# Patient Record
Sex: Male | Born: 1980 | Race: White | Hispanic: No | Marital: Single | Smoking: Former smoker
Health system: Southern US, Community
[De-identification: ages and names within clinical notes are randomized; demographics above are authoritative.]

## PROBLEM LIST (undated history)

## (undated) DIAGNOSIS — K219 Gastro-esophageal reflux disease without esophagitis: Secondary | ICD-10-CM

## (undated) DIAGNOSIS — R519 Headache, unspecified: Secondary | ICD-10-CM

## (undated) DIAGNOSIS — B019 Varicella without complication: Secondary | ICD-10-CM

## (undated) DIAGNOSIS — R51 Headache: Secondary | ICD-10-CM

## (undated) HISTORY — PX: ADENOIDECTOMY: SUR15

## (undated) HISTORY — DX: Headache: R51

## (undated) HISTORY — DX: Gastro-esophageal reflux disease without esophagitis: K21.9

## (undated) HISTORY — PX: TONSILLECTOMY: SUR1361

## (undated) HISTORY — PX: FINGER SURGERY: SHX640

## (undated) HISTORY — DX: Varicella without complication: B01.9

## (undated) HISTORY — DX: Headache, unspecified: R51.9

---
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
## 2003-08-22 ENCOUNTER — Emergency Department (HOSPITAL_COMMUNITY): Admission: EM | Admit: 2003-08-22 | Discharge: 2003-08-22 | Payer: Self-pay | Admitting: Emergency Medicine

## 2004-01-20 ENCOUNTER — Emergency Department (HOSPITAL_COMMUNITY): Admission: AC | Admit: 2004-01-20 | Discharge: 2004-01-20 | Payer: Self-pay

## 2004-01-26 ENCOUNTER — Emergency Department (HOSPITAL_COMMUNITY): Admission: EM | Admit: 2004-01-26 | Discharge: 2004-01-26 | Payer: Self-pay | Admitting: Emergency Medicine

## 2004-03-08 ENCOUNTER — Emergency Department (HOSPITAL_COMMUNITY): Admission: EM | Admit: 2004-03-08 | Discharge: 2004-03-08 | Payer: Self-pay | Admitting: Emergency Medicine

## 2005-06-22 ENCOUNTER — Emergency Department (HOSPITAL_COMMUNITY): Admission: EM | Admit: 2005-06-22 | Discharge: 2005-06-22 | Payer: Self-pay | Admitting: Family Medicine

## 2006-03-14 ENCOUNTER — Emergency Department (HOSPITAL_COMMUNITY): Admission: AD | Admit: 2006-03-14 | Discharge: 2006-03-14 | Payer: Self-pay | Admitting: Family Medicine

## 2006-11-15 ENCOUNTER — Emergency Department (HOSPITAL_COMMUNITY): Admission: EM | Admit: 2006-11-15 | Discharge: 2006-11-15 | Payer: Self-pay | Admitting: Emergency Medicine

## 2007-05-10 ENCOUNTER — Emergency Department (HOSPITAL_COMMUNITY): Admission: EM | Admit: 2007-05-10 | Discharge: 2007-05-10 | Payer: Self-pay | Admitting: Emergency Medicine

## 2007-05-24 ENCOUNTER — Ambulatory Visit: Payer: Self-pay | Admitting: *Deleted

## 2007-05-24 ENCOUNTER — Ambulatory Visit: Payer: Self-pay | Admitting: Internal Medicine

## 2009-05-25 ENCOUNTER — Emergency Department (HOSPITAL_COMMUNITY): Admission: EM | Admit: 2009-05-25 | Discharge: 2009-05-26 | Payer: Self-pay | Admitting: Emergency Medicine

## 2011-03-16 LAB — RAPID STREP SCREEN (MED CTR MEBANE ONLY): Streptococcus, Group A Screen (Direct): NEGATIVE

## 2011-04-13 ENCOUNTER — Ambulatory Visit (INDEPENDENT_AMBULATORY_CARE_PROVIDER_SITE_OTHER): Payer: Self-pay

## 2011-04-13 ENCOUNTER — Inpatient Hospital Stay (INDEPENDENT_AMBULATORY_CARE_PROVIDER_SITE_OTHER)
Admission: RE | Admit: 2011-04-13 | Discharge: 2011-04-13 | Disposition: A | Discharge status: Home / Self Care | Payer: Self-pay | Source: Ambulatory Visit | Attending: Family Medicine | Admitting: Family Medicine

## 2011-04-13 DIAGNOSIS — S90129A Contusion of unspecified lesser toe(s) without damage to nail, initial encounter: Secondary | ICD-10-CM

## 2011-04-24 NOTE — Op Note (Signed)
[place]. [facility]  Patient:    Erik Rogers                  MRN: [id number] Proc. Date: 11/05/99 Adm. Date:  [id number] Attending:  Marlowe Shores                           Operative Report  PREOPERATIVE DIAGNOSIS:  Complex laceration, dorsal aspect, right index finger.  POSTOPERATIVE DIAGNOSIS:  Complex laceration, dorsal aspect, right index finger.  OPERATION PERFORMED: 1. Repair of radial collateral ligament and joint capsule, right index finger,    metacarpophalangeal joint. 2. Repair of extensor tendon dorsal aspect, proximal phalanx, right index finger.  SURGEON:  Artist Pais. Mina Marble, M.D.  ANESTHESIA:  General.  TOURNIQUET TIME:  42 minutes.  COMPLICATIONS:  None.  DRAINS:  None.  DESCRIPTION OF PROCEDURE:  The patient was taken to the operating room and after induction of adequate general anesthesia, the right upper extremity was prepped and draped in the usual sterile fashion.  An Esmarch was used to exsanguinate the limb. The tourniquet was inflated to 250 mmHg.  At this point in time, two stellate type lacerations over the dorsal aspect of the right hand in line with the metacarpal ray of the index finger and the proximal phalanx of the index finger were approached.  The sutures that had been placed in the ER previously were removed and the incisions were widened with a 15 blade.  The proximal incision over the metacarpophalangeal joint was opened to expose a complete laceration of the sagittal band, collateral ligament on the radial side of the metacarpophalangeal joint and the joint capsule itself with exposed proximal phalangeal base and metacarpal head.  The joint was thoroughly irrigated and debrided.  The capsule  after thorough irrigation and debridement was closed with a running 4-0 Mersilene stitch followed by repair of the collateral ligament and the sagittal band using 4-0 Mersilene as well.   Attention was then paid to the dorsal aspect of the index finger over the proximal phalanx where the laceration that had been previously sutured in the emergency department was opened exposing a complex laceration to the extensor tendon over the proximal phalanx.  The extensor tendon was repaired using a combination of 4-0 Mersilene and a running 6-0 Prolene epitendinous stitch. he wounds were then thoroughly irrigated.  The rough edges were debrided and the wounds were then closed with 4-0 nylon in simple and horizontal mattress sutures combined.  A sterile dressing of Xeroform, 4 x 4s, fluffs and a splint with the  wrist in slight extension, the fingers in extension was applied.  The patient tolerated the procedure well and went to the recovery room in stable fashion. DD:  11/05/99 TD:  11/06/99 Job: [id number]

## 2012-02-05 ENCOUNTER — Emergency Department (HOSPITAL_COMMUNITY)
Admission: EM | Admit: 2012-02-05 | Discharge: 2012-02-06 | Discharge status: Against Medical Advice | Payer: No Typology Code available for payment source | Attending: Emergency Medicine | Admitting: Emergency Medicine

## 2012-02-05 ENCOUNTER — Encounter (HOSPITAL_COMMUNITY): Payer: Self-pay | Admitting: *Deleted

## 2012-02-05 DIAGNOSIS — Z043 Encounter for examination and observation following other accident: Secondary | ICD-10-CM | POA: Insufficient documentation

## 2012-02-05 NOTE — ED Notes (Signed)
Called x1. No answer.
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
## 2012-02-06 ENCOUNTER — Emergency Department (INDEPENDENT_AMBULATORY_CARE_PROVIDER_SITE_OTHER): Payer: No Typology Code available for payment source

## 2012-02-06 ENCOUNTER — Encounter (HOSPITAL_COMMUNITY): Payer: Self-pay | Admitting: Emergency Medicine

## 2012-02-06 ENCOUNTER — Emergency Department (INDEPENDENT_AMBULATORY_CARE_PROVIDER_SITE_OTHER)
Admission: EM | Admit: 2012-02-06 | Discharge: 2012-02-06 | Disposition: A | Discharge status: Home / Self Care | Payer: Self-pay | Source: Home / Self Care | Attending: Family Medicine | Admitting: Family Medicine

## 2012-02-06 DIAGNOSIS — M25519 Pain in unspecified shoulder: Secondary | ICD-10-CM

## 2012-02-06 DIAGNOSIS — M542 Cervicalgia: Secondary | ICD-10-CM

## 2012-02-06 DIAGNOSIS — M545 Low back pain: Secondary | ICD-10-CM

## 2012-02-06 DIAGNOSIS — M549 Dorsalgia, unspecified: Secondary | ICD-10-CM

## 2012-02-06 MED ORDER — HYDROCODONE-ACETAMINOPHEN 5-325 MG PO TABS: 1.0000 | ORAL_TABLET | Freq: Three times a day (TID) | ORAL | Status: AC | PRN

## 2012-02-06 MED ORDER — KETOROLAC TROMETHAMINE 60 MG/2ML IM SOLN
INTRAMUSCULAR | Status: AC
  Filled 2012-02-06: qty 2

## 2012-02-06 MED ORDER — KETOROLAC TROMETHAMINE 60 MG/2ML IM SOLN
60.0000 mg | Freq: Once | INTRAMUSCULAR | Status: AC
  Administered 2012-02-06: 60 mg via INTRAMUSCULAR

## 2012-02-06 MED ORDER — ORPHENADRINE CITRATE ER 100 MG PO TB12: 100.0000 mg | ORAL_TABLET | Freq: Two times a day (BID) | ORAL | Status: AC

## 2012-02-06 MED ORDER — MELOXICAM 15 MG PO TABS: 15.0000 mg | ORAL_TABLET | Freq: Every day | ORAL | Status: AC

## 2012-02-06 NOTE — Discharge Instructions (Signed)
Lumbosacral Strain Lumbosacral strain is one of the most common causes of back pain. There are many causes of back pain. Most are not serious conditions. CAUSES  Your backbone (spinal column) is made up of 24 main vertebral bodies, the sacrum, and the coccyx. These are held together by muscles and tough, fibrous tissue (ligaments). Nerve roots pass through the openings between the vertebrae. A sudden move or injury to the back may cause injury to, or pressure on, these nerves. This may result in localized back pain or pain movement (radiation) into the buttocks, down the leg, and into the foot. Sharp, shooting pain from the buttock down the back of the leg (sciatica) is frequently associated with a ruptured (herniated) disk. Pain may be caused by muscle spasm alone. Your caregiver can often find the cause of your pain by the details of your symptoms and an exam. In some cases, you may need tests (such as X-rays). Your caregiver will work with you to decide if any tests are needed based on your specific exam. HOME CARE INSTRUCTIONS   Avoid an underactive lifestyle. Active exercise, as directed by your caregiver, is your greatest weapon against back pain.   Avoid hard physical activities (tennis, racquetball, waterskiing) if you are not in proper physical condition for it. This may aggravate or create problems.   If you have a back problem, avoid sports requiring sudden body movements. Swimming and walking are generally safer activities.   Maintain good posture.   Avoid becoming overweight (obese).   Use bed rest for only the most extreme, sudden (acute) episode. Your caregiver will help you determine how much bed rest is necessary.   For acute conditions, you may put ice on the injured area.   Put ice in a plastic bag.   Place a towel between your skin and the bag.   Leave the ice on for 15 to 20 minutes at a time, every 2 hours, or as needed.   After you are improved and more active, it  may help to apply heat for 30 minutes before activities.  See your caregiver if you are having pain that lasts longer than expected. Your caregiver can advise appropriate exercises or therapy if needed. With conditioning, most back problems can be avoided. SEEK IMMEDIATE MEDICAL CARE IF:   You have numbness, tingling, weakness, or problems with the use of your arms or legs.   You experience severe back pain not relieved with medicines.   There is a change in bowel or bladder control.   You have increasing pain in any area of the body, including your belly (abdomen).   You notice shortness of breath, dizziness, or feel faint.   You feel sick to your stomach (nauseous), are throwing up (vomiting), or become sweaty.   You notice discoloration of your toes or legs, or your feet get very cold.   Your back pain is getting worse.   You have a fever.  MAKE SURE YOU:   Understand these instructions.   Will watch your condition.   Will get help right away if you are not doing well or get worse.  Document Released: 09/02/2005 Document Revised: 08/05/2011 Document Reviewed: 02/22/2009 [facility] Patient Information 2012 [place], [place].Cervical Sprain and Strain A cervical sprain is an injury to the neck. The injury can include either over-stretching or even small tears in the ligaments that hold the bones of the neck in place. A strain affects muscles and tendons. Minor injuries usually only involve ligaments and  muscles. Because the different parts of the neck are so close together, more severe injuries can involve both sprain and strain. These injuries can affect the muscles, ligaments, tendons, discs, and nerves in the neck. CAUSES  An injury may be the result of a direct blow or from certain habits that can lead to the symptoms noted above.  Injury from:   Contact sports (such as football, rugby, wrestling, hockey, auto racing, gymnastics, diving, martial arts, and boxing).   Motor  vehicle accidents.   Whiplash injuries (see image at right). These are common. They occur when the neck is forcefully whipped or forced backward and/or forward.   Falls.   Lifestyle or awkward postures:   Cradling a telephone between the ear and shoulder.   Sitting in a chair that offers no support.   Working at an Theme park manager station.   Activities that require hours of repeated or long periods of looking up (stretching the neck backward) or looking down (bending the head/neck forward).  SYMPTOMS   Pain, soreness, stiffness, or burning sensation in the front, back, or sides of the neck. This may develop immediately after injury. Onset of discomfort may also develop slowly and not begin for 24 hours or more.   Shoulder and/or upper back pain.   Limits to the normal movement of the neck.   Headache.   Dizziness.   Weakness and/or abnormal sensation (such as numbness or tingling) of one or both arms and/or hands.   Muscle spasm.   Difficulty with swallowing or chewing.   Tenderness and swelling at the injury site.  DIAGNOSIS  Most of the time, your caregiver can diagnose this problem with a careful history and examination. The history will include information about known problems (such as arthritis in the neck) or a previous neck injury. X-rays may be ordered to find out if there is a different problem. X-rays can also help to find problems with the bones of the neck not related to the injury or current symptoms. TREATMENT  Several treatment options are available to help pain, spasm, and other symptoms. They include:  Cold helps relieve pain and reduce inflammation. Cold should be applied for 10 to 15 minutes every 2 to 3 hours after any activity that aggravates your symptoms. Use ice packs or an ice massage. Place a towel or cloth in between your skin and the ice pack.   Medication:   Only take over-the-counter or prescription medicines for pain, discomfort, or  fever as directed by your caregiver.   Pain relievers or muscle relaxants may be prescribed. Use only as directed and only as much as you need.   Change in the activity that caused the problem. This might include using a headset with a telephone so that the phone is not propped between your ear and shoulder.   Neck collar. Your caregiver may recommend temporary use of a soft cervical collar.   Work station. Changes may be needed in your work place. A better sitting position and/or better posture during work may be part of your treatment.   Physical Therapy. Your caregiver may recommend physical therapy. This can include instructions in the use of stretching and strengthening exercises. Improvement in posture is important. Exercises and posture training can help stabilize the neck and strengthen muscles and keep symptoms from returning.  HOME CARE INSTRUCTIONS  Other than formal physical therapy, all treatments above can be done at home. Even when not at work, it is important to be conscious of  your posture and of activities that can cause a return of symptoms. Most cervical sprains and/or strains are better in 1-3 weeks. As you improve and increase activities, doing a warm up and stretching before the activity will help prevent recurrent problems. SEEK MEDICAL CARE IF:   Pain is not effectively controlled with medication.   You feel unable to decrease pain medication over time as planned.   Activity level is not improving as planned and/or expected.  SEEK IMMEDIATE MEDICAL CARE IF:   While using medication, you develop any bleeding, stomach upset, or signs of an allergic reaction.   Symptoms get worse, become intolerable, and are not helped by medications.   New, unexplained symptoms develop.   You experience numbness, tingling, weakness, or paralysis of any part of your body.  MAKE SURE YOU:   Understand these instructions.   Will watch your condition.   Will get help right away  if you are not doing well or get worse.  Document Released: 09/20/2007 Document Revised: 08/05/2011 Document Reviewed: 09/20/2007 [facility] Patient Information 2012 [place], [place].
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
## 2012-02-06 NOTE — ED Notes (Signed)
Patient transported to X-ray.  Medicine given while in xray.
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
## 2012-02-12 ENCOUNTER — Emergency Department (HOSPITAL_COMMUNITY)
Admission: EM | Admit: 2012-02-12 | Discharge: 2012-02-12 | Disposition: A | Discharge status: Home Health | Payer: Self-pay | Source: Home / Self Care | Attending: Family Medicine | Admitting: Family Medicine

## 2012-02-12 ENCOUNTER — Encounter (HOSPITAL_COMMUNITY): Payer: Self-pay

## 2012-02-12 DIAGNOSIS — IMO0002 Reserved for concepts with insufficient information to code with codable children: Secondary | ICD-10-CM

## 2012-02-12 MED ORDER — CYCLOBENZAPRINE HCL 5 MG PO TABS: 5.0000 mg | ORAL_TABLET | Freq: Three times a day (TID) | ORAL | Status: AC | PRN
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
## 2012-02-12 NOTE — Discharge Instructions (Signed)
Heat to your back and use medication as needed, see orthopedist if further problems.

## 2014-01-24 ENCOUNTER — Encounter (HOSPITAL_COMMUNITY): Payer: Self-pay | Admitting: Emergency Medicine

## 2014-01-24 ENCOUNTER — Emergency Department (INDEPENDENT_AMBULATORY_CARE_PROVIDER_SITE_OTHER)
Admission: EM | Admit: 2014-01-24 | Discharge: 2014-01-24 | Disposition: A | Discharge status: Home / Self Care | Payer: Self-pay | Source: Home / Self Care

## 2014-01-24 DIAGNOSIS — K089 Disorder of teeth and supporting structures, unspecified: Secondary | ICD-10-CM

## 2014-01-24 DIAGNOSIS — K0889 Other specified disorders of teeth and supporting structures: Secondary | ICD-10-CM

## 2014-01-24 DIAGNOSIS — S025XXA Fracture of tooth (traumatic), initial encounter for closed fracture: Secondary | ICD-10-CM

## 2014-01-24 MED ORDER — HYDROCODONE-ACETAMINOPHEN 5-325 MG PO TABS: 1.0000 | ORAL_TABLET | ORAL | Status: DC | PRN

## 2014-01-24 MED ORDER — CLINDAMYCIN HCL 300 MG PO CAPS: 300.0000 mg | ORAL_CAPSULE | Freq: Three times a day (TID) | ORAL | Status: DC

## 2014-01-24 NOTE — ED Notes (Signed)
Clarified with pharmacist that Erik Rasmussendavid mabe, np agreed to patient having hydrocodone filled without filling antibiotic at this tims

## 2014-01-24 NOTE — ED Provider Notes (Signed)
CSN: [id number]     Arrival date & time 01/24/14  1117 History   First MD Initiated Contact with Patient 01/24/14 1201     Chief Complaint  Patient presents with  . Dental Pain     (Consider location/radiation/quality/duration/timing/severity/associated sxs/prior Treatment) HPI Comments: 33 year old male complaining of a toothache for approximately one week. The painful tooth is the upper left premolar tooth. Approximately 2 days ago he was talking and a fragment of tooth fell out of his mouth. Complaining of pain radiating into the face and jaw.   History reviewed. No pertinent past medical history. Past Surgical History  Procedure Laterality Date  . Tonsillectomy    . Adenoidectomy    . Finger surgery      R index   Family History  Problem Relation Age of Onset  . Hypertension Father   . Thyroid disease Father   . Anemia Brother   . Stroke Other    History  Substance Use Topics  . Smoking status: Current Every Day Smoker -- 1.00 packs/day  . Smokeless tobacco: Not on file  . Alcohol Use: Yes     Comment: 1 q month    Review of Systems  Constitutional: Negative.   HENT: Positive for dental problem. Negative for ear pain and facial swelling.   All other systems reviewed and are negative.      Allergies  Penicillins cross reactors  Home Medications   Current Outpatient Rx  Name  Route  Sig  Dispense  Refill  . acetaminophen (TYLENOL) 325 MG tablet   Oral   Take 650 mg by mouth every 6 (six) hours as needed.         . Aspirin-Acetaminophen (GOODYS BODY PAIN PO)   Oral   Take by mouth.         . clindamycin (CLEOCIN) 300 MG capsule   Oral   Take 1 capsule (300 mg total) by mouth 3 (three) times daily. X 10 days   30 capsule   0   . HYDROcodone-acetaminophen (NORCO/VICODIN) 5-325 MG per tablet   Oral   Take 1 tablet by mouth every 4 (four) hours as needed.   15 tablet   0   . ibuprofen (ADVIL,MOTRIN) 200 MG tablet   Oral   Take 400 mg by mouth  every 6 (six) hours as needed. For pain          BP 134/93  Pulse 74  Temp(Src) 98.1 F (36.7 C) (Oral)  Resp 18  SpO2 98% Physical Exam  Nursing note and vitals reviewed. Constitutional: He is oriented to person, place, and time. He appears well-developed and well-nourished. No distress.  HENT:  Mouth/Throat: Oropharynx is clear and moist. No oropharyngeal exudate.  There is a superficial enamel fracture of the left upper pre-molar tooth. The tooth is tender. There no signs of surrounding gingival erythema, or swelling. No buccal swelling, puffiness or other evidence of infection.  Neck: Normal range of motion. Neck supple.  Neurological: He is alert and oriented to person, place, and time. He exhibits normal muscle tone.  Skin: Skin is warm and dry.  Psychiatric: He has a normal mood and affect.    ED Course  Procedures (including critical care time) Labs Review Labs Reviewed - No data to display Imaging Review No results found.    MDM   Final diagnoses:  Avulsion fracture of tooth  Toothache      Norco 5 mg #15 Clindamycin 300 tid: No signs of infection now,  if has more pain, swelling, redness, gum or mouth swelling take the ABX Find dentist ASAP  Hayden Rasmussen, NP 01/24/14 1218

## 2014-01-24 NOTE — ED Provider Notes (Signed)
Medical screening examination/treatment/procedure(s) were performed by non-physician practitioner and as supervising physician I was immediately available for consultation/collaboration.  Leslee Homeavid Arienna Benegas, M.D.  Reuben Likesavid C Paije Goodhart, MD 01/24/14 [phone]

## 2014-01-24 NOTE — ED Notes (Signed)
Tooth ache

## 2014-01-24 NOTE — Discharge Instructions (Signed)
Dental Fracture You have a dental fracture or injury. This can mean the tooth is loose, has a chip in the enamel or is broken. If just the outer enamel is chipped, there is a good chance the tooth will not become infected. The only treatment needed may be to smooth off a rough edge. Fractures into the deeper layers (dentin and pulp) cause greater pain and are more likely to become infected. These require you to see a dentist as soon as possible to save the tooth. Loose teeth may need to be wired or bonded with a plastic splint to hold them in place. A paste may be painted on the open area of the broken tooth to reduce the pain. Antibiotics and pain medicine may be prescribed. Choosing a soft or liquid diet and rinsing the mouth out with warm water after meals may be helpful. See your dentist as recommended. Failure to seek care or follow up with a dentist or other specialist as recommended could result in the loss of your tooth, infection, or permanent dental problems. SEEK MEDICAL CARE IF:   You have increased pain not controlled with medicines.  You have swelling around the tooth, in the face or neck.  You have bleeding which starts, continues, or gets worse.  You have a fever. Document Released: 12/31/2004 Document Revised: 02/15/2012 Document Reviewed: 10/15/2009 [facility] Patient Information 2014 [place], [place].  Dental Pain A tooth ache may be caused by cavities (tooth decay). Cavities expose the nerve of the tooth to air and hot or cold temperatures. It may come from an infection or abscess (also called a boil or furuncle) around your tooth. It is also often caused by dental caries (tooth decay). This causes the pain you are having. DIAGNOSIS  Your caregiver can diagnose this problem by exam. TREATMENT   If caused by an infection, it may be treated with medications which kill germs (antibiotics) and pain medications as prescribed by your caregiver. Take medications as directed.  Only  take over-the-counter or prescription medicines for pain, discomfort, or fever as directed by your caregiver.  Whether the tooth ache today is caused by infection or dental disease, you should see your dentist as soon as possible for further care. SEEK MEDICAL CARE IF: The exam and treatment you received today has been provided on an emergency basis only. This is not a substitute for complete medical or dental care. If your problem worsens or new problems (symptoms) appear, and you are unable to meet with your dentist, call or return to this location. SEEK IMMEDIATE MEDICAL CARE IF:   You have a fever.  You develop redness and swelling of your face, jaw, or neck.  You are unable to open your mouth.  You have severe pain uncontrolled by pain medicine. MAKE SURE YOU:   Understand these instructions.  Will watch your condition.  Will get help right away if you are not doing well or get worse. Document Released: 11/23/2005 Document Revised: 02/15/2012 Document Reviewed: 07/11/2008 [facility] Patient Information 2014 [place], [place].  Tooth Injuries A tooth has many layers. The outside is the enamel. The enamel is the white part. Under the enamel is the dentin. Under the dentin is the pulp, the nerves, and the blood vessels. The top part is the crown. The bottom part is the root. Three common tooth injuries include:  Breaks (fractures) usually split the tooth into 2 or more parts.  Shifts of the tooth at the level of the root.  A tooth that  comes out. HOME CARE  Minor breaks usually do not require seeing a dentist right away.  Handle tooth fragments by the outside layer. Bring these fragments to the dentist.  A tooth can also be loosened by injury and show no sign of a problem. See a dentist for an X-ray. The X-ray will look for problems below the gum line.  Gently biting into gauze or a towel will help control bleeding. An exposed nerve requires a dental exam and care. Getting help  right away is not needed if the pain is controlled.  Only take medicine as told by your dentist.  Doreatha Martin all medicine as told by your dentist.  Avoid eating solid foods and see a dentist within 24 hours. GET HELP RIGHT AWAY IF:   The pain is becoming worse rather than better.  The pain does not get better with medicine.  You have increased puffiness (swelling) or redness in your face near the injured tooth. MAKE SURE YOU:  Understand these instructions.  Will watch your condition.  Will get help right away if you are not doing well or get worse. Document Released: 05/13/2010 Document Revised: 02/15/2012 Document Reviewed: 05/13/2010 [facility] Patient Information 2014 [place], [place].

## 2014-06-18 ENCOUNTER — Encounter (HOSPITAL_COMMUNITY): Payer: Self-pay | Admitting: Emergency Medicine

## 2014-06-18 ENCOUNTER — Emergency Department (HOSPITAL_COMMUNITY)
Admission: EM | Admit: 2014-06-18 | Discharge: 2014-06-18 | Disposition: A | Discharge status: Home / Self Care | Payer: Self-pay | Attending: Emergency Medicine | Admitting: Emergency Medicine

## 2014-06-18 DIAGNOSIS — K089 Disorder of teeth and supporting structures, unspecified: Secondary | ICD-10-CM | POA: Insufficient documentation

## 2014-06-18 DIAGNOSIS — Z79899 Other long term (current) drug therapy: Secondary | ICD-10-CM | POA: Insufficient documentation

## 2014-06-18 DIAGNOSIS — Z880 Allergy status to penicillin: Secondary | ICD-10-CM | POA: Insufficient documentation

## 2014-06-18 DIAGNOSIS — K0889 Other specified disorders of teeth and supporting structures: Secondary | ICD-10-CM

## 2014-06-18 DIAGNOSIS — F17200 Nicotine dependence, unspecified, uncomplicated: Secondary | ICD-10-CM | POA: Insufficient documentation

## 2014-06-18 DIAGNOSIS — K0381 Cracked tooth: Secondary | ICD-10-CM | POA: Insufficient documentation

## 2014-06-18 DIAGNOSIS — J029 Acute pharyngitis, unspecified: Secondary | ICD-10-CM | POA: Insufficient documentation

## 2014-06-18 MED ORDER — HYDROCODONE-ACETAMINOPHEN 5-325 MG PO TABS: 1.0000 | ORAL_TABLET | ORAL | Status: DC | PRN

## 2014-06-18 MED ORDER — PENICILLIN V POTASSIUM 500 MG PO TABS: 500.0000 mg | ORAL_TABLET | Freq: Four times a day (QID) | ORAL | Status: DC

## 2014-06-18 NOTE — ED Notes (Signed)
Declined W/C at D/C and was escorted to lobby by RN. 

## 2014-06-18 NOTE — ED Notes (Signed)
Patient states R upper tooth broke in April.  Patient states has had some pain associated with it, but recently started having facial swelling and possible infection with the pain.   Patient states he didn't go to a dentist.

## 2014-06-18 NOTE — ED Provider Notes (Signed)
CSN: [id number]     Arrival date & time 06/18/14  1148 History  This chart was scribed for non-physician practitioner Emilia Beck, PA-C working with Rolland Porter, MD by Leone Payor, ED Scribe. This patient was seen in room TR06C/TR06C and the patient's care was started at 1:47 PM.    Chief Complaint  Patient presents with  . Dental Pain    The history is provided by the patient. No language interpreter was used.    HPI Comments: Erik Rogers is a 33 y.o. male who presents to the Emergency Department complaining of constant, gradually worsened right upper dental pain with associated facial swelling that began a couple of days ago. Patient states the affected tooth was fractured in April 2015 but the pain did not begin until recently. He describes the pain as sharp and pressure and rates it as 10/10 currently. He states eating food and cold worsens the pain. He reports having a sore throat, rhinorrhea for the past few days. He denies drainage, fever.   History reviewed. No pertinent past medical history. Past Surgical History  Procedure Laterality Date  . Tonsillectomy    . Adenoidectomy    . Finger surgery      R index   Family History  Problem Relation Age of Onset  . Hypertension Father   . Thyroid disease Father   . Anemia Brother   . Stroke Other    History  Substance Use Topics  . Smoking status: Current Every Day Smoker -- 1.00 packs/day    Types: Cigarettes  . Smokeless tobacco: Not on file  . Alcohol Use: Yes     Comment: 1 q month    Review of Systems  Constitutional: Negative for fever, chills and fatigue.  HENT: Positive for dental problem, facial swelling, rhinorrhea and sore throat. Negative for trouble swallowing.   Eyes: Negative for visual disturbance.  Respiratory: Negative for shortness of breath.   Cardiovascular: Negative for chest pain and palpitations.  Gastrointestinal: Negative for nausea, vomiting, abdominal pain and diarrhea.   Genitourinary: Negative for dysuria and difficulty urinating.  Musculoskeletal: Negative for arthralgias and neck pain.  Skin: Negative for color change.  Neurological: Negative for dizziness and weakness.  Psychiatric/Behavioral: Negative for dysphoric mood.      Allergies  Penicillins cross reactors  Home Medications   Prior to Admission medications   Medication Sig Start Date End Date Taking? Authorizing Provider  acetaminophen (TYLENOL) 325 MG tablet Take 650 mg by mouth every 6 (six) hours as needed.    Historical Provider, MD  Aspirin-Acetaminophen (GOODYS BODY PAIN PO) Take by mouth.    Historical Provider, MD  clindamycin (CLEOCIN) 300 MG capsule Take 1 capsule (300 mg total) by mouth 3 (three) times daily. X 10 days 01/24/14   Hayden Rasmussen, NP  HYDROcodone-acetaminophen (NORCO/VICODIN) 5-325 MG per tablet Take 1 tablet by mouth every 4 (four) hours as needed. 01/24/14   Hayden Rasmussen, NP  ibuprofen (ADVIL,MOTRIN) 200 MG tablet Take 400 mg by mouth every 6 (six) hours as needed. For pain    Historical Provider, MD   BP 105/77  Pulse 68  Temp(Src) 97.9 F (36.6 C) (Oral)  Resp 18  Ht 6' (1.829 m)  Wt 180 lb (81.647 kg)  BMI 24.41 kg/m2  SpO2 98% Physical Exam  Nursing note and vitals reviewed. Constitutional: He is oriented to person, place, and time. He appears well-developed and well-nourished. No distress.  HENT:  Head: Normocephalic and atraumatic.  Poor dentition. Premolar of  left upper jaw is cracked and tender to percussion. No abscess noted.   Eyes: Conjunctivae are normal.  Neck: Normal range of motion.  Cardiovascular: Normal rate and regular rhythm.  Exam reveals no gallop and no friction rub.   No murmur heard. Pulmonary/Chest: Effort normal and breath sounds normal. He has no wheezes. He has no rales. He exhibits no tenderness.  Abdominal: Soft. He exhibits no distension. There is no tenderness.  Musculoskeletal: Normal range of motion.  Neurological: He is  alert and oriented to person, place, and time.  Speech is goal-oriented. Moves limbs without ataxia.   Skin: Skin is warm and dry.  Psychiatric: He has a normal mood and affect.    ED Course  Procedures (including critical care time)  DIAGNOSTIC STUDIES: Oxygen Saturation is 98% on RA, normal by my interpretation.    COORDINATION OF CARE: 1:51 PM Discussed treatment plan with pt at bedside and pt agreed to plan.   Labs Review Labs Reviewed - No data to display  Imaging Review No results found.   EKG Interpretation None      MDM   Final diagnoses:  Pain, dental    Patient referred to a dentist for follow up. Patient will have Vicodin and Veetid for dental pain. Vitals stable and patient afebrile. No signs of ludwigs angina. Patient instructed to return with worsening or concerning symptoms.   I personally performed the services described in this documentation, which was scribed in my presence. The recorded information has been reviewed and is accurate.   Emilia BeckKaitlyn Evens Meno, New JerseyPA-C 06/19/14 [phone]

## 2014-06-18 NOTE — Discharge Instructions (Signed)
Take Veetid as directed until gone. Take Vicodin as needed for pain. Follow up with the recommended dentist. Refer to attached documents for more information.

## 2014-06-27 NOTE — ED Provider Notes (Signed)
Medical screening examination/treatment/procedure(s) were conducted as a shared visit with non-physician practitioner(s) and myself.  I personally evaluated the patient during the encounter.   EKG Interpretation None        Rolland PorterMark Roshard Rezabek, MD 06/27/14 1022

## 2014-07-15 ENCOUNTER — Encounter (HOSPITAL_COMMUNITY): Payer: Self-pay | Admitting: Emergency Medicine

## 2014-07-15 ENCOUNTER — Emergency Department (INDEPENDENT_AMBULATORY_CARE_PROVIDER_SITE_OTHER)
Admission: EM | Admit: 2014-07-15 | Discharge: 2014-07-15 | Disposition: A | Discharge status: Home / Self Care | Payer: Self-pay | Source: Home / Self Care | Attending: Family Medicine | Admitting: Family Medicine

## 2014-07-15 DIAGNOSIS — IMO0002 Reserved for concepts with insufficient information to code with codable children: Secondary | ICD-10-CM

## 2014-07-15 DIAGNOSIS — W57XXXA Bitten or stung by nonvenomous insect and other nonvenomous arthropods, initial encounter: Secondary | ICD-10-CM

## 2014-07-15 DIAGNOSIS — L03113 Cellulitis of right upper limb: Secondary | ICD-10-CM

## 2014-07-15 DIAGNOSIS — T148 Other injury of unspecified body region: Secondary | ICD-10-CM

## 2014-07-15 MED ORDER — CEPHALEXIN 500 MG PO CAPS: 500.0000 mg | ORAL_CAPSULE | Freq: Four times a day (QID) | ORAL | Status: DC

## 2014-07-15 MED ORDER — SULFAMETHOXAZOLE-TRIMETHOPRIM 800-160 MG PO TABS: 2.0000 | ORAL_TABLET | Freq: Two times a day (BID) | ORAL | Status: DC

## 2014-07-15 NOTE — ED Provider Notes (Signed)
Erik Rogers is a 33 y.o. male who presents to Urgent Care today for spider bite right wrist. Patient works outside. 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 The pain is mild and pt notes moderate itching. The redness has worsened and the patient has developed redness streaking proximally to his elbow.  This has occurred over the past 2 days. No fevers chills NVD. No tick bites.    History reviewed. No pertinent past medical history. History  Substance Use Topics  . Smoking status: Current Every Day Smoker -- 1.00 packs/day    Types: Cigarettes  . Smokeless tobacco: Not on file  . Alcohol Use: Yes     Comment: 1 q month   ROS as above Medications: No current facility-administered medications for this encounter.   Current Outpatient Prescriptions  Medication Sig Dispense Refill  . cephALEXin (KEFLEX) 500 MG capsule Take 1 capsule (500 mg total) by mouth 4 (four) times daily.  40 capsule  0  . sulfamethoxazole-trimethoprim (SEPTRA DS) 800-160 MG per tablet Take 2 tablets by mouth 2 (two) times daily.  28 tablet  0    Exam:  BP 128/86  Pulse 78  Temp(Src) 98.3 F (36.8 C) (Oral)  Resp 20  Ht 6' (1.829 m)  Wt 190 lb (86.183 kg)  BMI 25.76 kg/m2  SpO2 100% Gen: Well NAD Right volar wrist: Small dark central ulcerated area with surrounding erythemia and tenderness aprox 2 cm in diameter. Extending non-tender streaking erythremia present.  Lymphangitis extending to elbow.     No results found for this or any previous visit (from the past 24 hour(s)). No results found.  Assessment and Plan: 33 y.o. male with spider bite vs cellulitis.  Plan to treat with keflex and bactrim.  Present to ED if worsening.  Return as needed  Discussed warning signs or symptoms. Please see discharge instructions. Patient expresses understanding.   This note was created using Conservation officer, historic buildingsDragon voice recognition software. Any transcription errors are unintended.     Rodolph BongEvan S Octave Montrose, MD 07/15/14 [phone]

## 2014-07-15 NOTE — ED Notes (Signed)
Patient c/o redness and swelling right inner wrist x 2 days. Patient reports it was very itchy. Now he haas red streaks up his right forearm. Patient reports he "felt sick and feverish" yesterday. Patient is alert and oriented and in no acute distress.

## 2014-07-15 NOTE — Discharge Instructions (Signed)
Thank you for coming in today. Take both keflex and bactrim.  Come back as needed.  Go to the ER if worse.   Cellulitis Cellulitis is an infection of the skin and the tissue beneath it. The infected area is usually red and tender. Cellulitis occurs most often in the arms and lower legs.  CAUSES  Cellulitis is caused by bacteria that enter the skin through cracks or cuts in the skin. The most common types of bacteria that cause cellulitis are staphylococci and streptococci. SIGNS AND SYMPTOMS   Redness and warmth.  Swelling.  Tenderness or pain.  Fever. DIAGNOSIS  Your health care provider can usually determine what is wrong based on a physical exam. Blood tests may also be done. TREATMENT  Treatment usually involves taking an antibiotic medicine. HOME CARE INSTRUCTIONS   Take your antibiotic medicine as directed by your health care provider. Finish the antibiotic even if you start to feel better.  Keep the infected arm or leg elevated to reduce swelling.  Apply a warm cloth to the affected area up to 4 times per day to relieve pain.  Take medicines only as directed by your health care provider.  Keep all follow-up visits as directed by your health care provider. SEEK MEDICAL CARE IF:   You notice red streaks coming from the infected area.  Your red area gets larger or turns dark in color.  Your bone or joint underneath the infected area becomes painful after the skin has healed.  Your infection returns in the same area or another area.  You notice a swollen bump in the infected area.  You develop new symptoms.  You have a fever. SEEK IMMEDIATE MEDICAL CARE IF:   You feel very sleepy.  You develop vomiting or diarrhea.  You have a general ill feeling (malaise) with muscle aches and pains. MAKE SURE YOU:   Understand these instructions.  Will watch your condition.  Will get help right away if you are not doing well or get worse. Document Released:  09/02/2005 Document Revised: 04/09/2014 Document Reviewed: 02/08/2012 [facility] Patient Information 2015 [place]ExitCare, [place]LL[place]. This information is not intended to replace advice given to you by your health care provider. Make sure you discuss any questions you have with your health care provider.

## 2015-08-05 ENCOUNTER — Ambulatory Visit (INDEPENDENT_AMBULATORY_CARE_PROVIDER_SITE_OTHER): Payer: 59

## 2015-08-05 ENCOUNTER — Ambulatory Visit (INDEPENDENT_AMBULATORY_CARE_PROVIDER_SITE_OTHER): Payer: 59 | Admitting: Emergency Medicine

## 2015-08-05 VITALS — BP 128/68 | HR 76 | Temp 98.3°F | Resp 18 | Ht 72.0 in | Wt 195.4 lb

## 2015-08-05 DIAGNOSIS — M25532 Pain in left wrist: Secondary | ICD-10-CM

## 2015-08-05 DIAGNOSIS — S63502A Unspecified sprain of left wrist, initial encounter: Secondary | ICD-10-CM | POA: Diagnosis not present

## 2015-08-05 MED ORDER — NAPROXEN SODIUM 550 MG PO TABS: 550.0000 mg | ORAL_TABLET | Freq: Two times a day (BID) | ORAL | Status: DC

## 2015-08-05 NOTE — Patient Instructions (Signed)
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
## 2016-03-30 ENCOUNTER — Ambulatory Visit (INDEPENDENT_AMBULATORY_CARE_PROVIDER_SITE_OTHER): Payer: 59 | Admitting: Family Medicine

## 2016-03-30 ENCOUNTER — Encounter: Payer: Self-pay | Admitting: Family Medicine

## 2016-03-30 VITALS — BP 138/92 | HR 81 | Temp 98.1°F | Ht 71.0 in | Wt 197.5 lb

## 2016-03-30 DIAGNOSIS — G2581 Restless legs syndrome: Secondary | ICD-10-CM | POA: Diagnosis not present

## 2016-03-30 DIAGNOSIS — B070 Plantar wart: Secondary | ICD-10-CM | POA: Diagnosis not present

## 2016-03-30 DIAGNOSIS — J020 Streptococcal pharyngitis: Secondary | ICD-10-CM

## 2016-03-30 DIAGNOSIS — K219 Gastro-esophageal reflux disease without esophagitis: Secondary | ICD-10-CM

## 2016-03-30 LAB — POCT RAPID STREP A (OFFICE): Rapid Strep A Screen: POSITIVE — AB

## 2016-03-30 MED ORDER — PANTOPRAZOLE SODIUM 40 MG PO TBEC: 40.0000 mg | DELAYED_RELEASE_TABLET | Freq: Every day | ORAL | Status: DC

## 2016-03-30 MED ORDER — GABAPENTIN 300 MG PO CAPS: 300.0000 mg | ORAL_CAPSULE | Freq: Every day | ORAL | Status: DC

## 2016-03-30 NOTE — Patient Instructions (Signed)
Take the gabapentin nightly for the restless legs. If we need to increase we can.  Take the protonix daily for reflux.  Follow up annually or sooner if needed.  Take care  Dr. Adriana Simasook

## 2016-03-30 NOTE — Progress Notes (Signed)
Pre visit review using our clinic review tool, if applicable. No additional management support is needed unless otherwise documented below in the visit note. 

## 2016-03-31 DIAGNOSIS — K219 Gastro-esophageal reflux disease without esophagitis: Secondary | ICD-10-CM | POA: Insufficient documentation

## 2016-03-31 DIAGNOSIS — G2581 Restless legs syndrome: Secondary | ICD-10-CM | POA: Insufficient documentation

## 2016-03-31 DIAGNOSIS — B070 Plantar wart: Secondary | ICD-10-CM | POA: Insufficient documentation

## 2016-03-31 DIAGNOSIS — J020 Streptococcal pharyngitis: Secondary | ICD-10-CM | POA: Insufficient documentation

## 2016-03-31 MED ORDER — AZITHROMYCIN 250 MG PO TABS
ORAL_TABLET | ORAL | Status: DC
Start: 2016-03-31 — End: 2016-05-21

## 2016-03-31 NOTE — Assessment & Plan Note (Signed)
New problem. Rapid strep positive. Treating with Azithromycin.

## 2016-03-31 NOTE — Assessment & Plan Note (Signed)
Trial of Gabapentin. 

## 2016-03-31 NOTE — Progress Notes (Signed)
Subjective:  Patient ID: Erik Rogers, male    DOB: Jul 17, 1981  Age: 35 y.o. MRN: [id number]  CC: Sore throat/Neck pain/Ear pain; Restless Leg; L foot "wound"; GERD  HPI Erik Rogers is a 35 y.o. male presents to the clinic today with the above complaints.  Sore throat  Started yesterday.  Moderate in severity.  Associated Left ear pain and anterior neck pain.  No associated fever, chills.  No known exacerbating or relieving factors.  RLS  Patient reports he has had restless leg for years.  Troublesome at night.  Requesting medication today.  GERD  Long standing.  Uncontrolled.  Using OTC meds with little improvement.  No dysphagia or other red flag symptoms (weight loss, abdominal pain, etc).  "Wound"  Patient reports an area of concern on his left foot.  Has been present for nearly a year.  Intermittently painful.  No drainage or reported redness.  Likely exacerbated by foot wear.  No known relieving factors.  PMH, Surgical Hx, Family Hx, Social History reviewed and updated as below.  Past Medical History  Diagnosis Date  . Chicken pox   . GERD (gastroesophageal reflux disease)   . Frequent headaches    Past Surgical History  Procedure Laterality Date  . Tonsillectomy    . Adenoidectomy    . Finger surgery      R index   Family History  Problem Relation Age of Onset  . Hypertension Father   . Thyroid disease Father   . Anemia Brother   . Cancer Brother   . Heart disease Brother   . Stroke Other   . Heart disease Mother   . Stroke Mother    Social History  Substance Use Topics  . Smoking status: Current Every Day Smoker -- 1.00 packs/day    Types: Cigarettes  . Smokeless tobacco: Never Used  . Alcohol Use: No     Comment: 1 q month   Review of Systems  HENT: Positive for tinnitus.   Cardiovascular: Positive for leg swelling.  Skin: Positive for wound.  Neurological: Positive for headaches.    Psychiatric/Behavioral:       Stress.  All other systems reviewed and are negative.  Objective:   Today's Vitals: BP 138/92 mmHg  Pulse 81  Temp(Src) 98.1 F (36.7 C) (Oral)  Ht 5\' 11"  (1.803 m)  Wt 197 lb 8 oz (89.585 kg)  BMI 27.56 kg/m2  SpO2 98%  Physical Exam  Constitutional: He is oriented to person, place, and time. He appears well-developed and well-nourished. No distress.  HENT:  Head: Normocephalic and atraumatic.  Mild oropharyngeal erythema. No exudate. Normal TM's bilaterally.   Eyes: Conjunctivae are normal. No scleral icterus.  Neck: Neck supple.  Anterior cervical lymphadenopathy (left).  Cardiovascular: Normal rate and regular rhythm.   No murmur heard. Pulmonary/Chest: Effort normal and breath sounds normal. He has no wheezes. He has no rales.  Abdominal: Soft. He exhibits no distension. There is no tenderness. There is no rebound and no guarding.  Musculoskeletal: Normal range of motion. He exhibits no edema.  Neurological: He is alert and oriented to person, place, and time.  Skin:  Left lateral foot (plantar) - small plantar wart noted.  Psychiatric: He has a normal mood and affect.  Vitals reviewed.  Assessment & Plan:   Problem List Items Addressed This Visit    Acute streptococcal pharyngitis - Primary    New problem. Rapid strep positive. Treating with Azithromycin.  Relevant Medications   azithromycin (ZITHROMAX) 250 MG tablet   Other Relevant Orders   POCT rapid strep A (Completed)   RLS (restless legs syndrome)    Trial of Gabapentin.      GERD (gastroesophageal reflux disease)    Uncontrolled. Treating with Protonix.      Relevant Medications   pantoprazole (PROTONIX) 40 MG tablet   Plantar wart    Discussed treatment options. Patient will wait at this time as it is not particularly bothersome.      Relevant Medications   azithromycin (ZITHROMAX) 250 MG tablet      Outpatient Encounter Prescriptions as of  03/30/2016  Medication Sig  . azithromycin (ZITHROMAX) 250 MG tablet 2 tablets on Day 1; 1 tablet on Day 2-5.  Marland Kitchen gabapentin (NEURONTIN) 300 MG capsule Take 1 capsule (300 mg total) by mouth at bedtime.  . pantoprazole (PROTONIX) 40 MG tablet Take 1 tablet (40 mg total) by mouth daily.  . [DISCONTINUED] cephALEXin (KEFLEX) 500 MG capsule Take 1 capsule (500 mg total) by mouth 4 (four) times daily. (Patient not taking: Reported on 08/05/2015)  . [DISCONTINUED] IBUPROFEN PO Take by mouth.  . [DISCONTINUED] naproxen sodium (ANAPROX DS) 550 MG tablet Take 1 tablet (550 mg total) by mouth 2 (two) times daily with a meal.  . [DISCONTINUED] sulfamethoxazole-trimethoprim (SEPTRA DS) 800-160 MG per tablet Take 2 tablets by mouth 2 (two) times daily. (Patient not taking: Reported on 08/05/2015)   No facility-administered encounter medications on file as of 03/30/2016.    Follow-up: Annually.  Everlene Other DO [facility]

## 2016-03-31 NOTE — Assessment & Plan Note (Signed)
Uncontrolled. Treating with Protonix.

## 2016-03-31 NOTE — Assessment & Plan Note (Signed)
Discussed treatment options. Patient will wait at this time as it is not particularly bothersome.

## 2016-04-26 ENCOUNTER — Encounter: Payer: Self-pay | Admitting: Emergency Medicine

## 2016-04-26 ENCOUNTER — Emergency Department
Admission: EM | Admit: 2016-04-26 | Discharge: 2016-04-26 | Disposition: A | Discharge status: Home / Self Care | Payer: 59 | Attending: Emergency Medicine | Admitting: Emergency Medicine

## 2016-04-26 DIAGNOSIS — F17210 Nicotine dependence, cigarettes, uncomplicated: Secondary | ICD-10-CM | POA: Diagnosis not present

## 2016-04-26 DIAGNOSIS — H9202 Otalgia, left ear: Secondary | ICD-10-CM | POA: Diagnosis present

## 2016-04-26 DIAGNOSIS — K122 Cellulitis and abscess of mouth: Secondary | ICD-10-CM | POA: Diagnosis not present

## 2016-04-26 MED ORDER — CLINDAMYCIN HCL 300 MG PO CAPS: 300.0000 mg | ORAL_CAPSULE | Freq: Three times a day (TID) | ORAL | Status: DC

## 2016-04-26 MED ORDER — IBUPROFEN 800 MG PO TABS: 800.0000 mg | ORAL_TABLET | Freq: Three times a day (TID) | ORAL | Status: AC | PRN

## 2016-04-26 NOTE — ED Notes (Signed)
C/O left ear pain, left jaw pain.  Onset of symptoms last night.

## 2016-04-26 NOTE — Discharge Instructions (Signed)
Please see your dentist in follow up this week.   Dental Abscess    A dental abscess is a collection of pus in or around a tooth.  CAUSES  This condition is caused by a bacterial infection around the root of the tooth that involves the inner part of the tooth (pulp). It may result from:  Severe tooth decay.  Trauma to the tooth that allows bacteria to enter into the pulp, such as a broken or chipped tooth.  Severe gum disease around a tooth. SYMPTOMS  Symptoms of this condition include:  Severe pain in and around the infected tooth.  Swelling and redness around the infected tooth, in the mouth, or in the face.  Tenderness.  Pus drainage.  Bad breath.  Bitter taste in the mouth.  Difficulty swallowing.  Difficulty opening the mouth.  Nausea.  Vomiting.  Chills.  Swollen neck glands.  Fever. DIAGNOSIS  This condition is diagnosed with examination of the infected tooth. During the exam, your dentist may tap on the infected tooth. Your dentist will also ask about your medical and dental history and may order X-rays.  TREATMENT  This condition is treated by eliminating the infection. This may be done with:  Antibiotic medicine.  A root canal. This may be performed to save the tooth.  Pulling (extracting) the tooth. This may also involve draining the abscess. This is done if the tooth cannot be saved. HOME CARE INSTRUCTIONS  Take medicines only as directed by your dentist.  If you were prescribed antibiotic medicine, finish all of it even if you start to feel better.  Rinse your mouth (gargle) often with salt water to relieve pain or swelling.  Do not drive or operate heavy machinery while taking pain medicine.  Do not apply heat to the outside of your mouth.  Keep all follow-up visits as directed by your dentist. This is important. SEEK MEDICAL CARE IF:  Your pain is worse and is not helped by medicine. SEEK IMMEDIATE MEDICAL CARE IF:  You have a fever or chills.  Your symptoms  suddenly get worse.  You have a very bad headache.  You have problems breathing or swallowing.  You have trouble opening your mouth.  You have swelling in your neck or around your eye. This information is not intended to replace advice given to you by your health care provider. Make sure you discuss any questions you have with your health care provider.  Document Released: 11/23/2005 Document Revised: 04/09/2015 Document Reviewed: 11/20/2014  Elsevier Interactive Patient Education [employer]2016 [employer]vier Inc.

## 2016-04-26 NOTE — ED Notes (Signed)
NAD noted at time of D/C. Pt denies questions or concerns. Pt ambulatory to the lobby at this time.  

## 2016-04-26 NOTE — ED Provider Notes (Signed)
[facility] Emergency Department Provider Note  ____________________________________________  Time seen: Approximately 11:45 AM  I have reviewed the triage vital signs and the nursing notes.   HISTORY  Chief Complaint Otalgia    HPI Erik Rogers is a 35 y.o. male , NAD, presents to the emergency department with one-day history of left ear and mouth pain. States pain onset suddenly last night which caused him decreased sleep. States he looked in his mouth and saw redness around one of his wisdom teeth. Does note he has occasional TMJ pain when he wakes in the mornings but is uncertain of any chronic finding or clenching of his teeth. Does note that he is due for a follow-up with his dentist and plans to see them on Monday. Denies any injury or trauma to the face or mouth. Has not had any nasal congestion, runny nose, sneezing, sinus pressure, discharge from the ears, cough, chest congestion. Has not had fever, chills, body aches. Denies any abdominal pain, nausea, vomiting.   Past Medical History  Diagnosis Date  . Chicken pox   . GERD (gastroesophageal reflux disease)   . Frequent headaches     Patient Active Problem List   Diagnosis Date Noted  . Acute streptococcal pharyngitis 03/31/2016  . RLS (restless legs syndrome) 03/31/2016  . GERD (gastroesophageal reflux disease) 03/31/2016  . Plantar wart 03/31/2016    Past Surgical History  Procedure Laterality Date  . Tonsillectomy    . Adenoidectomy    . Finger surgery      R index    Current Outpatient Rx  Name  Route  Sig  Dispense  Refill  . azithromycin (ZITHROMAX) 250 MG tablet      2 tablets on Day 1; 1 tablet on Day 2-5.   6 tablet   0   . clindamycin (CLEOCIN) 300 MG capsule   Oral   Take 1 capsule (300 mg total) by mouth 3 (three) times daily.   30 capsule   0   . gabapentin (NEURONTIN) 300 MG capsule   Oral   Take 1 capsule (300 mg total) by mouth at bedtime.   90  capsule   0   . ibuprofen (ADVIL,MOTRIN) 800 MG tablet   Oral   Take 1 tablet (800 mg total) by mouth every 8 (eight) hours as needed (pain).   60 tablet   0   . pantoprazole (PROTONIX) 40 MG tablet   Oral   Take 1 tablet (40 mg total) by mouth daily.   90 tablet   0     Allergies Penicillins cross reactors  Family History  Problem Relation Age of Onset  . Hypertension Father   . Thyroid disease Father   . Anemia Brother   . Cancer Brother   . Heart disease Brother   . Stroke Other   . Heart disease Mother   . Stroke Mother     Social History Social History  Substance Use Topics  . Smoking status: Current Every Day Smoker -- 1.00 packs/day    Types: Cigarettes  . Smokeless tobacco: Never Used  . Alcohol Use: No     Comment: 1 q month     Review of Systems  Constitutional: No fever/chills, fatigue ENT: Positive left dental pain and left ear pain. No sore throat, sneezing, nasal congestion, runny nose, discharge from ears. Cardiovascular: No chest pain. Respiratory: No cough or chest congestion. No shortness of breath. No wheezing.  Gastrointestinal: No abdominal pain.  No  nausea, vomiting.  No diarrhea.  Musculoskeletal: Occasional TMJ pain. Negative for neck pain.  Skin: Negative for rash, redness, swelling, skin sores. Neurological: Negative for headaches, focal weakness or numbness. No tingling. 10-point ROS otherwise negative.  ____________________________________________   PHYSICAL EXAM:  VITAL SIGNS: ED Triage Vitals  Enc Vitals Group     BP 04/26/16 1118 124/72 mmHg     Pulse Rate 04/26/16 1118 65     Resp 04/26/16 1118 16     Temp 04/26/16 1118 98 F (36.7 C)     Temp Source 04/26/16 1118 Oral     SpO2 04/26/16 1118 99 %     Weight 04/26/16 1118 190 lb (86.183 kg)     Height 04/26/16 1118 6' (1.829 m)     Head Cir --      Peak Flow --      Pain Score 04/26/16 1119 10     Pain Loc --      Pain Edu? --      Excl. in GC? --       Constitutional: Alert and oriented. Well appearing and in no acute distress. Eyes: Conjunctivae are normal.  Head: Atraumatic. ENT:      Ears: TMs visualized bilaterally without erythema, effusion, bulging, perforation.      Nose: No congestion/rhinnorhea.      Mouth/Throat: Mucous membranes are moist. Pharynx without erythema, swelling, exudate. Uvula is midline. Mild erythema noted about the left medial gumline at the left wisdom tooth. Diffusely poor dentition. Neck: Supple with full range of motion. Hematological/Lymphatic/Immunilogical: No cervical lymphadenopathy. Cardiovascular: Normal rate, regular rhythm. Normal S1 and S2.  Good peripheral circulation with 2+ pulses noted in the upper extremities. Respiratory: Normal respiratory effort without tachypnea or retractions. Lungs CTAB with breath sounds noted in all lung fields. Musculoskeletal: Full range of motion of the TMJ without pain. No crepitus or popping noted about the TMJ. Neurologic:  Normal speech and language. Gait and posture are normal. Skin:  Skin is warm, dry and intact. No rash, redness, swelling, skin sores noted. Psychiatric: Mood and affect are normal. Speech and behavior are normal. Patient exhibits appropriate insight and judgement.   ____________________________________________   LABS  None ____________________________________________  EKG  None ____________________________________________  RADIOLOGY  None ____________________________________________    PROCEDURES  Procedure(s) performed: None    Medications - No data to display   ____________________________________________   INITIAL IMPRESSION / ASSESSMENT AND PLAN / ED COURSE  Patient's diagnosis is consistent with abscess and cellulitis of oral soft tissues. Patient will be discharged home with prescriptions for clindamycin and ibuprofen to take as directed. Patient is to follow up with his dentist this week for recheck.  Patient is given ED precautions to return to the ED for any worsening or new symptoms.      ____________________________________________  FINAL CLINICAL IMPRESSION(S) / ED DIAGNOSES  Final diagnoses:  Abscess or cellulitis, oral soft tissue      NEW MEDICATIONS STARTED DURING THIS VISIT:  Discharge Medication List as of 04/26/2016 11:59 AM    START taking these medications   Details  clindamycin (CLEOCIN) 300 MG capsule Take 1 capsule (300 mg total) by mouth 3 (three) times daily., Starting 04/26/2016, Until Discontinued, Print    ibuprofen (ADVIL,MOTRIN) 800 MG tablet Take 1 tablet (800 mg total) by mouth every 8 (eight) hours as needed (pain)., Starting 04/26/2016, Until Discontinued, Print             Hope Pigeon, PA-C 04/26/16 1229  Sharyn CreamerMark Quale, MD 04/26/16 [phone]

## 2016-05-21 ENCOUNTER — Encounter: Payer: Self-pay | Admitting: Family Medicine

## 2016-05-21 ENCOUNTER — Ambulatory Visit (INDEPENDENT_AMBULATORY_CARE_PROVIDER_SITE_OTHER): Payer: 59 | Admitting: Family Medicine

## 2016-05-21 VITALS — BP 126/90 | HR 82 | Temp 98.2°F | Wt 190.1 lb

## 2016-05-21 DIAGNOSIS — F32A Depression, unspecified: Secondary | ICD-10-CM

## 2016-05-21 DIAGNOSIS — F419 Anxiety disorder, unspecified: Principal | ICD-10-CM

## 2016-05-21 DIAGNOSIS — F418 Other specified anxiety disorders: Secondary | ICD-10-CM

## 2016-05-21 DIAGNOSIS — F329 Major depressive disorder, single episode, unspecified: Secondary | ICD-10-CM

## 2016-05-21 MED ORDER — ESCITALOPRAM OXALATE 10 MG PO TABS: 10.0000 mg | ORAL_TABLET | Freq: Every day | ORAL | Status: DC

## 2016-05-21 MED ORDER — TRAZODONE HCL 50 MG PO TABS: 25.0000 mg | ORAL_TABLET | Freq: Every evening | ORAL | Status: DC | PRN

## 2016-05-21 NOTE — Progress Notes (Signed)
Pre visit review using our clinic review tool, if applicable. No additional management support is needed unless otherwise documented below in the visit note. 

## 2016-05-21 NOTE — Patient Instructions (Signed)
Take the trazodone at bedtime for sleep.  Take the lexapro daily.  We will call with your appt to talk to someone.  Follow up in 6 weeks.  Take care  Dr. Adriana Simasook

## 2016-05-22 DIAGNOSIS — F419 Anxiety disorder, unspecified: Principal | ICD-10-CM

## 2016-05-22 DIAGNOSIS — Z720 Tobacco use: Secondary | ICD-10-CM | POA: Insufficient documentation

## 2016-05-22 DIAGNOSIS — F329 Major depressive disorder, single episode, unspecified: Secondary | ICD-10-CM | POA: Insufficient documentation

## 2016-05-22 DIAGNOSIS — F32A Depression, unspecified: Secondary | ICD-10-CM | POA: Insufficient documentation

## 2016-05-22 NOTE — Assessment & Plan Note (Signed)
New problem. Severe. Sending to psychology for therapy/counseling. Starting Lexapro. Note given for patient to be out of work.

## 2016-05-22 NOTE — Progress Notes (Signed)
Subjective:  Patient ID: Erik Rogers, male    DOB: 28-Jan-1981  Age: 35 y.o. MRN: [id number]  CC: Depression/Anxiety (note this is different from reported reason for the appt on schedule as patient did not want to disclose).  HPI:  35 year old male presents with several complaints today.  Patient states that he's recently been experiencing a lot of stress and difficulty in his personal life. Patient states he feels like he is having a nervous breakdown. He states that he's having significant difficulty concentrating and focusing particularly at work. He also states that he feels down and also feels anxious. He states that this is been going on for months but has recently worsened. It started after the death of his mother earlier last year (Sept). Her death was unexpected. This is been difficult for him. Thanks have recently worsened as he and his fiance recently split up approximately a month ago. Patient states that he is experiencing a lot of stress and anxiety and is having severe difficulty at work. He's also having insomnia. He states that he is been unable to work. He's not motivated to get out of bed and when he's at work he can't focus. Patient has spoken to his HR person and she recommended that he come in to be seen. He is at his wits end and does not know what to do. He's not sought any counseling or talked to anyone.  Social Hx   Social History   Social History  . Marital Status: Single    Spouse Name: N/A  . Number of Children: N/A  . Years of Education: N/A   Social History Main Topics  . Smoking status: Current Every Day Smoker -- 1.00 packs/day    Types: Cigarettes  . Smokeless tobacco: Never Used  . Alcohol Use: No     Comment: 1 q month  . Drug Use: No  . Sexual Activity:    Partners: Female    CopyBirth Control/ Protection: None   Other Topics Concern  . None   Social History Narrative   Review of Systems  Constitutional: Negative.     Psychiatric/Behavioral: Positive for sleep disturbance and decreased concentration. The patient is nervous/anxious.    Objective:  BP 126/90 mmHg  Pulse 82  Temp(Src) 98.2 F (36.8 C) (Oral)  Wt 190 lb 2 oz (86.24 kg)  SpO2 97%  BP/Weight 05/21/2016 04/26/2016 03/30/2016  Systolic BP 126 132 138  Diastolic BP 90 74 92  Wt. (Lbs) 190.13 190 197.5  BMI 25.78 25.76 27.56   Physical Exam  Constitutional: He is oriented to person, place, and time. He appears well-developed. No distress.  Eyes: Conjunctivae are normal. No scleral icterus.  Pulmonary/Chest: Effort normal.  Neurological: He is alert and oriented to person, place, and time.  Psychiatric:  Flat affect, depressed mood; also appears anxious.  Vitals reviewed.  PHQ-9 - 22. GAD-7 - 21  Assessment & Plan:   Problem List Items Addressed This Visit    Anxiety and depression - Primary    New problem. Severe. Sending to psychology for therapy/counseling. Starting Lexapro. Note given for patient to be out of work.         Meds ordered this encounter  Medications  . escitalopram (LEXAPRO) 10 MG tablet    Sig: Take 1 tablet (10 mg total) by mouth daily.    Dispense:  90 tablet    Refill:  1  . traZODone (DESYREL) 50 MG tablet    Sig: Take 0.5-1 tablets (  25-50 mg total) by mouth at bedtime as needed for sleep.    Dispense:  90 tablet    Refill:  0    Follow-up: Return in about 6 weeks (around 07/02/2016).  Everlene Other DO [facility]

## 2016-05-27 ENCOUNTER — Telehealth: Payer: Self-pay | Admitting: Family Medicine

## 2016-05-27 NOTE — Telephone Encounter (Signed)
Paperwork was received and placed in Dr.Cook's FMLA folder to complete.

## 2016-05-27 NOTE — Telephone Encounter (Signed)
Sheralyn Boatmanoni [phone] called from [employer] regarding wanting to know medical information from pt last visit. Claim number [id number]. Also regarding FMLA. Thank you!

## 2016-05-27 NOTE — Telephone Encounter (Signed)
Call them and ask to have it re-faxed to [phone]. Because we have not received.

## 2016-05-27 NOTE — Telephone Encounter (Signed)
Ethelene Brownsnthony stated that they will be faxing over an Attending Physician Statement combined with FMLA paperwork to  [phone].  Paperwork needs to be filled out by Dr. Adriana Simasook.

## 2016-06-10 ENCOUNTER — Telehealth: Payer: Self-pay | Admitting: Family Medicine

## 2016-06-10 NOTE — Telephone Encounter (Signed)
Patient called wanting a refill on Clindamycin to help with his tooth ache. He was seen recently by a dentist that referred him to a oral surgeon. However, due to the wait he was

## 2016-06-24 ENCOUNTER — Ambulatory Visit (INDEPENDENT_AMBULATORY_CARE_PROVIDER_SITE_OTHER): Payer: Self-pay | Admitting: Psychology

## 2016-06-24 DIAGNOSIS — F4310 Post-traumatic stress disorder, unspecified: Secondary | ICD-10-CM

## 2016-07-10 ENCOUNTER — Ambulatory Visit (INDEPENDENT_AMBULATORY_CARE_PROVIDER_SITE_OTHER): Payer: 59 | Admitting: Psychology

## 2016-07-10 DIAGNOSIS — F4310 Post-traumatic stress disorder, unspecified: Secondary | ICD-10-CM

## 2016-07-17 ENCOUNTER — Ambulatory Visit: Payer: 59 | Admitting: Psychology

## 2016-08-26 IMAGING — CR DG WRIST COMPLETE 3+V*L*
4 series · 4 of 4 positions shown · non-contrast
Comparison: None.

CLINICAL DATA: Left wrist pain after injury loading motorcycle.
Initial encounter.

EXAM:
LEFT WRIST - COMPLETE 3+ VIEW

[PA]
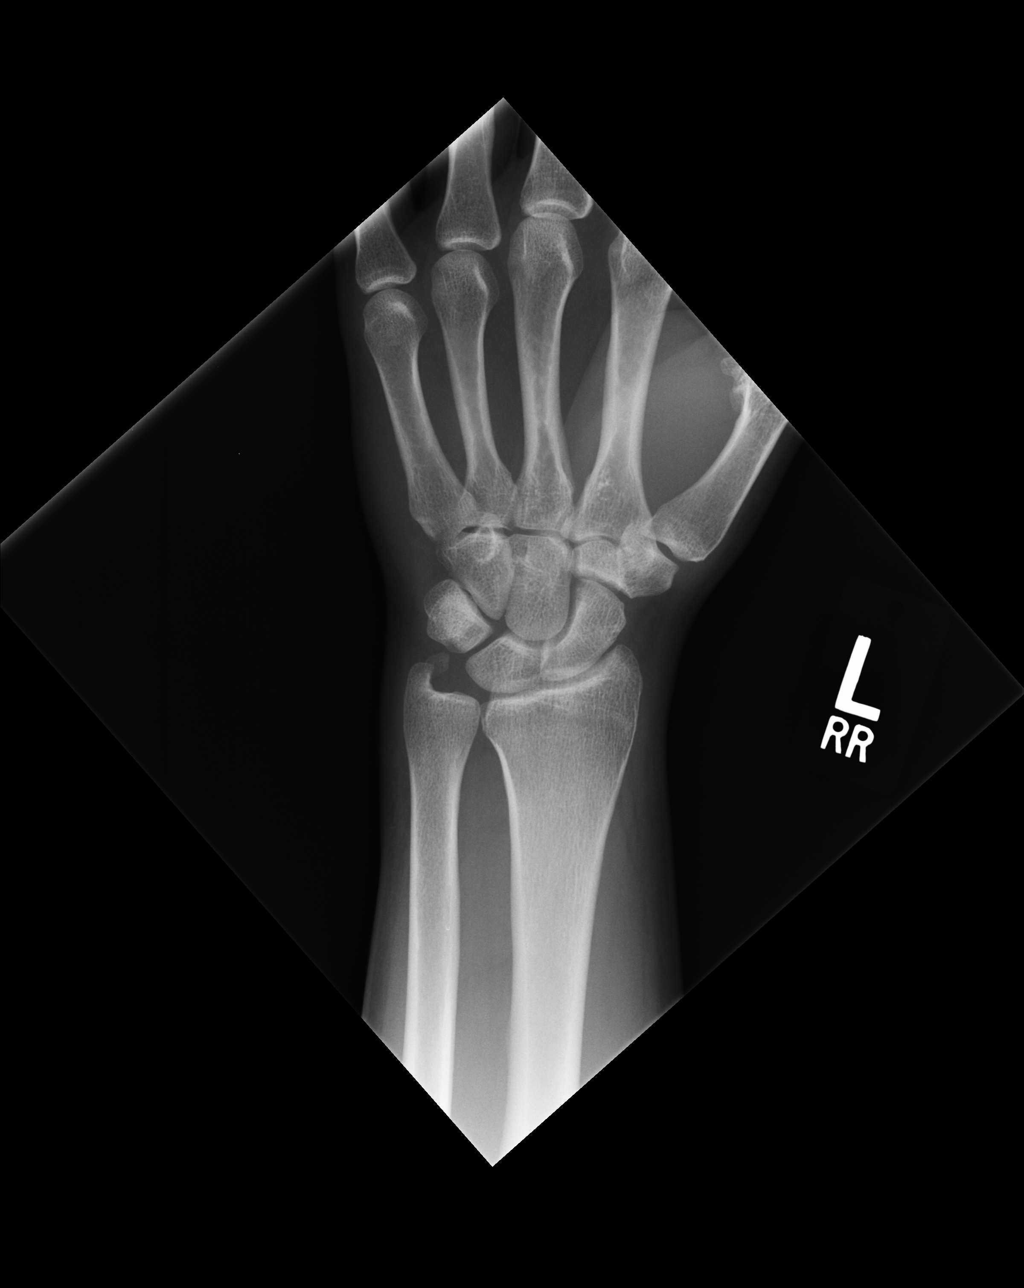

[pa int rot]
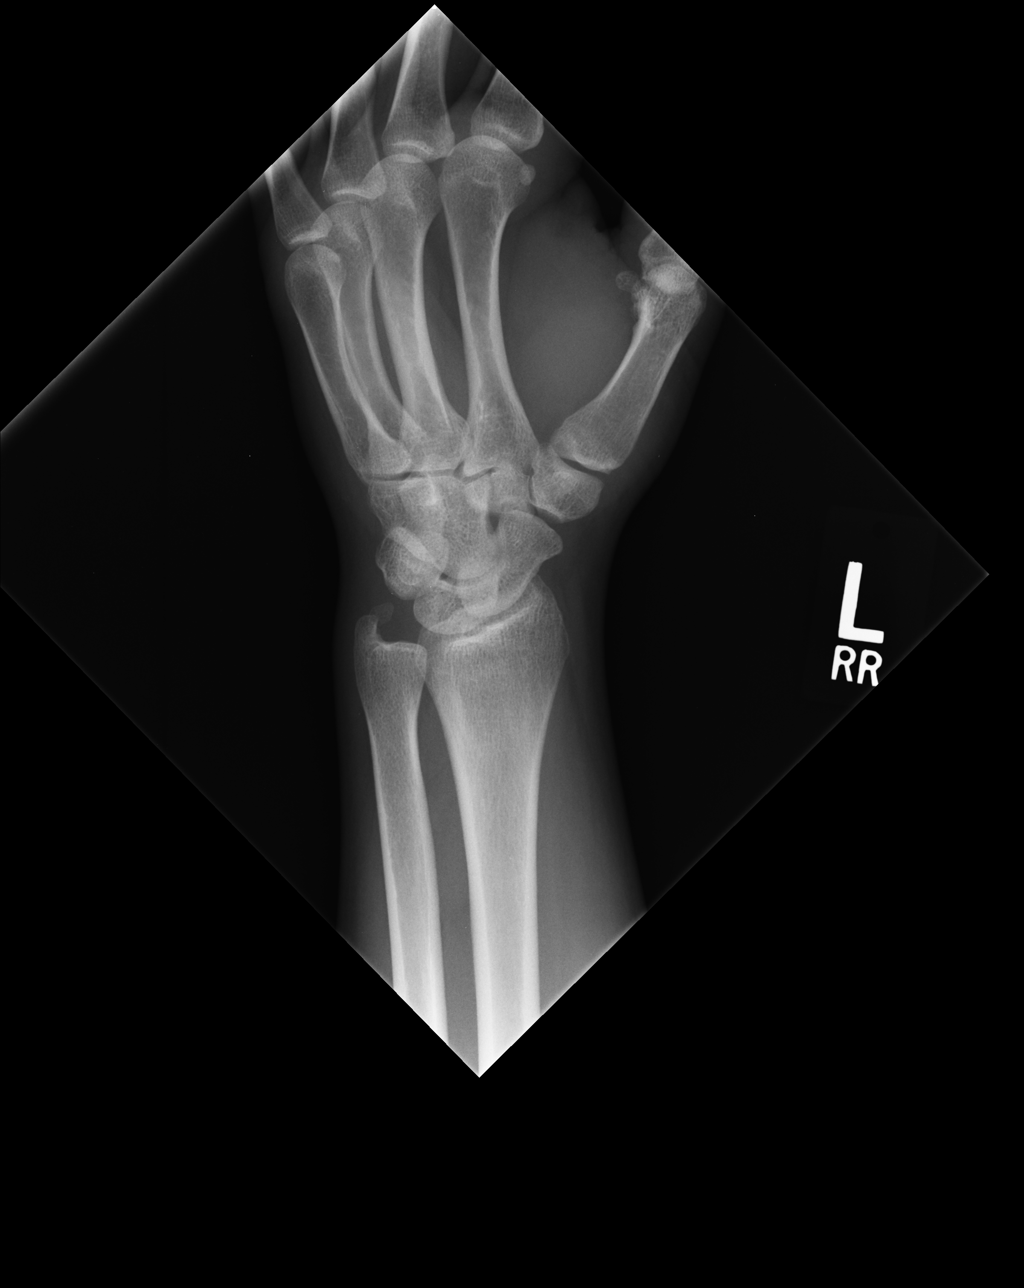

[lateral]
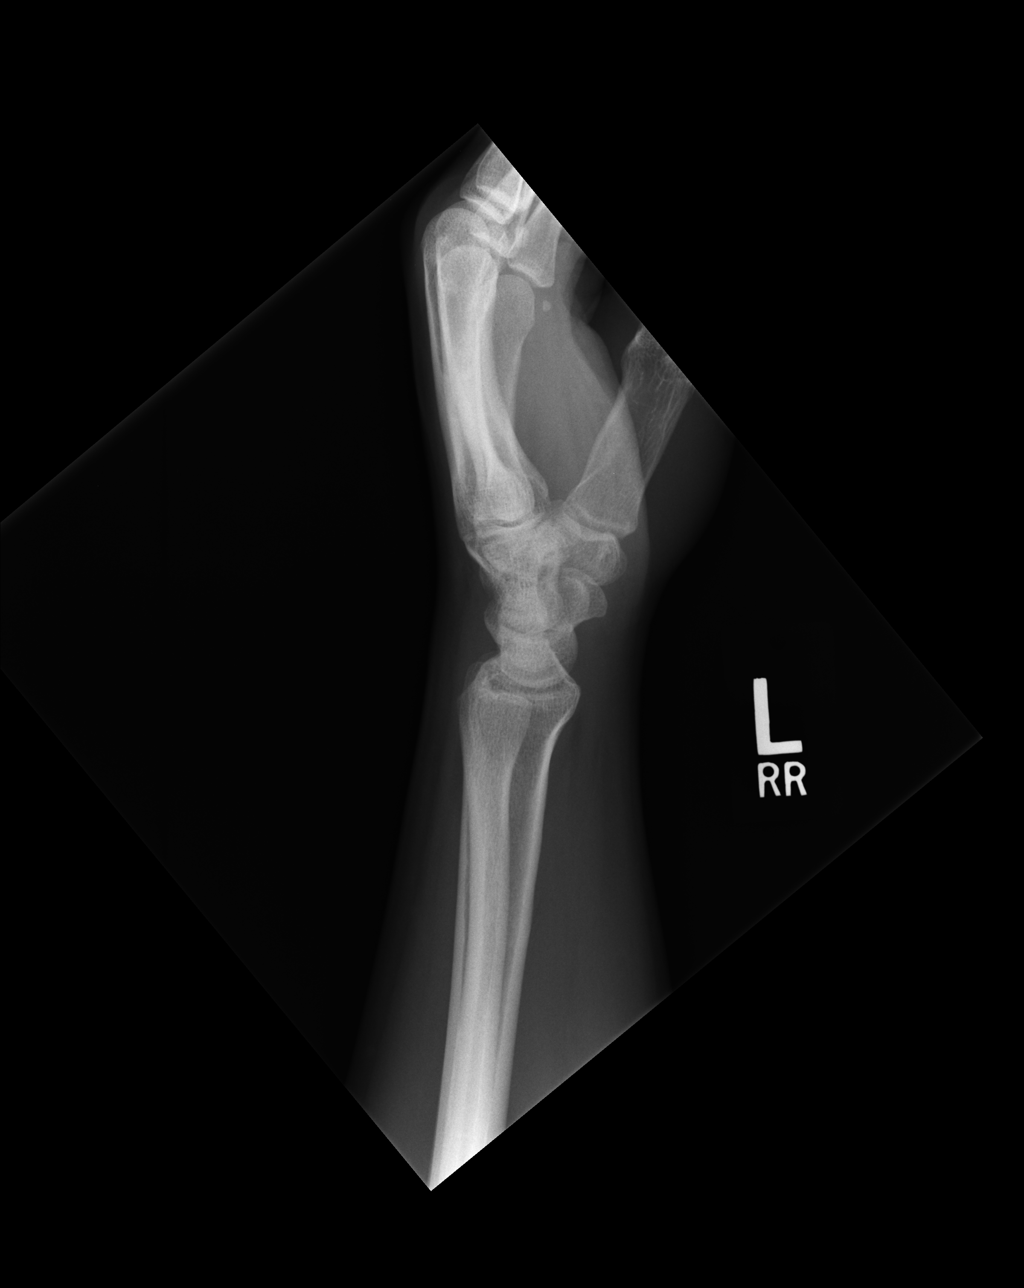

[ap ext rot]
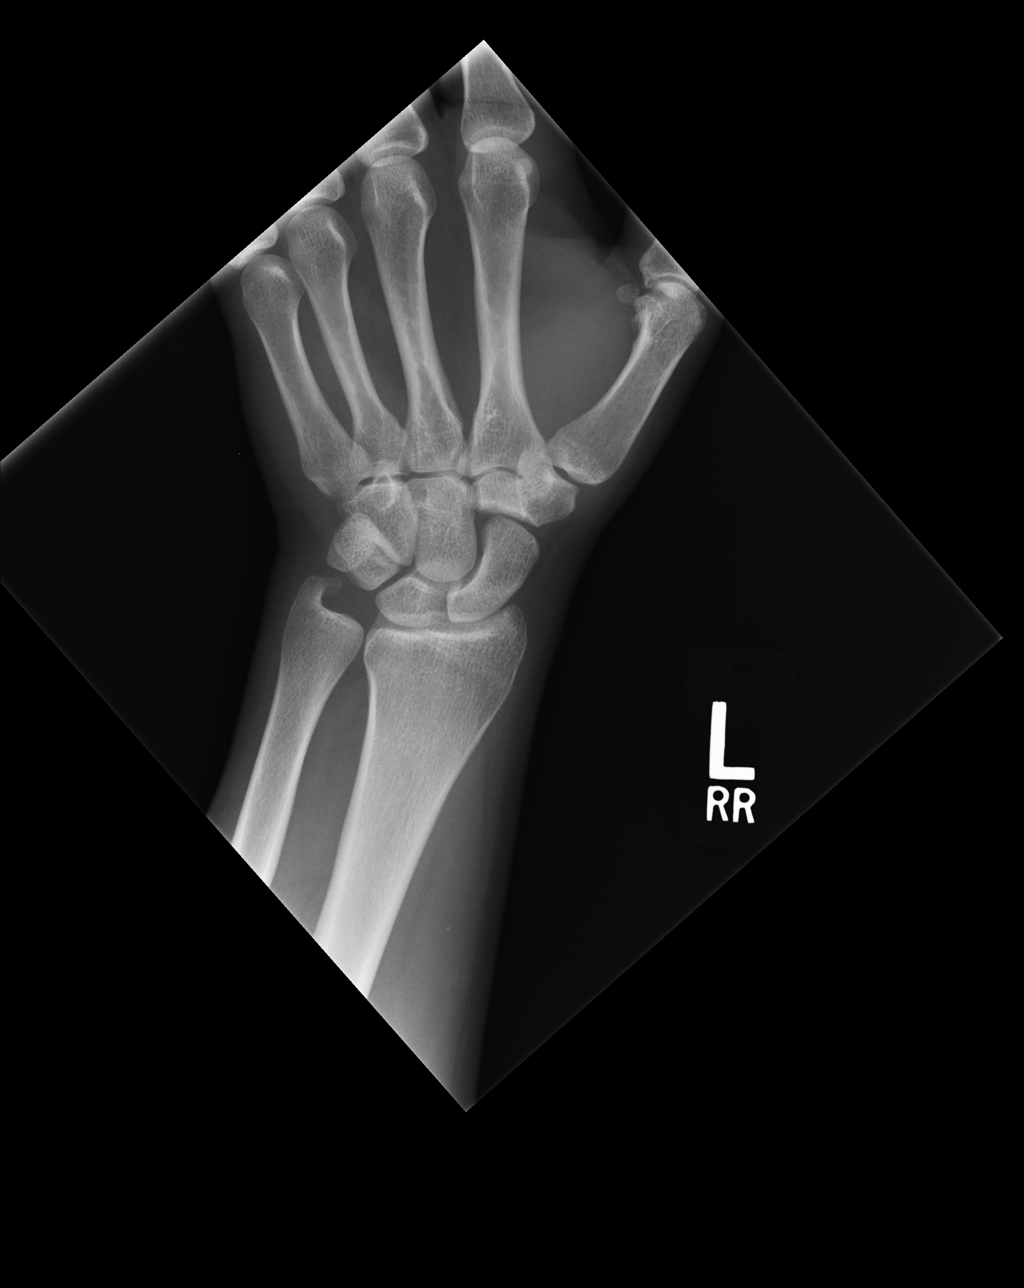

[4 of 4 positions shown; findings below may reference images not displayed]

FINDINGS: There is no evidence of fracture or dislocation. An ossicle at the
ulnar styloid is noted. Negative soft tissues.
IMPRESSION: Negative.

## 2017-01-29 ENCOUNTER — Ambulatory Visit (INDEPENDENT_AMBULATORY_CARE_PROVIDER_SITE_OTHER): Payer: Self-pay | Admitting: Family Medicine

## 2017-01-29 ENCOUNTER — Encounter: Payer: Self-pay | Admitting: Family Medicine

## 2017-01-29 VITALS — BP 151/105 | HR 75 | Temp 98.5°F | Wt 203.8 lb

## 2017-01-29 DIAGNOSIS — H6123 Impacted cerumen, bilateral: Secondary | ICD-10-CM

## 2017-01-29 DIAGNOSIS — K047 Periapical abscess without sinus: Secondary | ICD-10-CM | POA: Insufficient documentation

## 2017-01-29 MED ORDER — HYDROCODONE-ACETAMINOPHEN 5-325 MG PO TABS: 1.0000 | ORAL_TABLET | Freq: Three times a day (TID) | ORAL | 0 refills | Status: DC | PRN

## 2017-01-29 MED ORDER — CLINDAMYCIN HCL 150 MG PO CAPS: 450.0000 mg | ORAL_CAPSULE | Freq: Three times a day (TID) | ORAL | 0 refills | Status: DC

## 2017-01-29 NOTE — Progress Notes (Signed)
Pre visit review using our clinic review tool, if applicable. No additional management support is needed unless otherwise documented below in the visit note. 

## 2017-01-29 NOTE — Progress Notes (Signed)
Subjective:  Patient ID: Erik Rogers, male    DOB: [birth year]  Age: 36 y.o. MRN: [id number]  CC: Dental pain/jaw pain, ear discomfort  HPI:  36 year old male presents with the above complaints.  Patient reports a 1.5 week history of jaw/dental pain. He reports some associated left ear discomfort. He states that the pain is severe. He notes that he had his wisdom teeth are the culprit and that they need to be removed. He has been unable to do so secondary to cost. No fevers or chills. No known relieving factors. His dental pain is worsened by hot and cold. He reports that his pain is now radiating to his jaw and down his neck. He reports mild ear discomfort and a "squishy" feeling. No other complaints or concerns at this time.  Social Hx   Social History   Social History  . Marital status: Single    Spouse name: N/A  . Number of children: N/A  . Years of education: N/A   Social History Main Topics  . Smoking status: Current Every Day Smoker    Packs/day: 1.00    Types: Cigarettes  . Smokeless tobacco: Never Used  . Alcohol use No     Comment: 1 q month  . Drug use: No  . Sexual activity: Yes    Partners: Female    Birth control/ protection: None   Other Topics Concern  . None   Social History Narrative  . None   Review of Systems  Constitutional: Negative.   HENT: Positive for dental problem and ear pain.    Objective:  BP (!) 151/105   Pulse 75   Temp 98.5 F (36.9 C) (Oral)   Wt 203 lb 12.8 oz (92.4 kg)   SpO2 97%   BMI 27.64 kg/m   BP/Weight 01/29/2017 05/21/2016 04/26/2016  Systolic BP 151 126 132  Diastolic BP 105 90 74  Wt. (Lbs) 203.8 190.13 190  BMI 27.64 25.78 25.76   Physical Exam  Constitutional: He is oriented to person, place, and time. He appears well-developed. No distress.  HENT:  Mouth/Throat: Oropharynx is clear and moist.  Wisdom teeth - both are decaying. Mildly tender to palpation. No discrete abscess noted. No pain of the  TMJ. Partial obstruction of both TMs by cerumen. Visible portion of TM appears normal.  Neck: Neck supple.  Pulmonary/Chest: Effort normal.  Lymphadenopathy:    He has no cervical adenopathy.  Neurological: He is alert and oriented to person, place, and time.  Psychiatric: He has a normal mood and affect.  Vitals reviewed.  Assessment & Plan:   Problem List Items Addressed This Visit    Excessive cerumen in both ear canals    Irrigated successfully today.      Dental infection - Primary    New problem. Treating with Clinda and Vicodin as needed for pain. Advised to see dentist/oral surgeon.      Relevant Medications   clindamycin (CLEOCIN) 150 MG capsule     Meds ordered this encounter  Medications  . clindamycin (CLEOCIN) 150 MG capsule    Sig: Take 3 capsules (450 mg total) by mouth 3 (three) times daily.    Dispense:  63 capsule    Refill:  0  . HYDROcodone-acetaminophen (NORCO/VICODIN) 5-325 MG tablet    Sig: Take 1 tablet by mouth every 8 (eight) hours as needed for moderate pain.    Dispense:  15 tablet    Refill:  0    Follow-up: PRN  [place]

## 2017-01-29 NOTE — Assessment & Plan Note (Addendum)
New problem. Treating with Clinda and Vicodin as needed for pain. Advised to see dentist/oral surgeon.

## 2017-01-29 NOTE — Patient Instructions (Addendum)
Clindamycin as prescribed.  Pain medication as needed.  Be sure to see dentistry/oral surgeon.  Take care  Dr. Domenic Polite ook

## 2017-01-29 NOTE — Assessment & Plan Note (Signed)
Irrigated successfully today. 

## 2017-03-24 ENCOUNTER — Other Ambulatory Visit: Payer: Self-pay | Admitting: Family Medicine

## 2017-07-08 ENCOUNTER — Telehealth: Payer: Self-pay | Admitting: Family Medicine

## 2017-07-08 NOTE — Telephone Encounter (Signed)
fyi

## 2017-07-08 NOTE — Telephone Encounter (Signed)
Pt called c/o swollen throat. He states that he has had this last night, some difficulties trying to breath. He states that it hurts to open his mouth, it is very sore. He says that this Sent call to [facility] triage.  Call pt @ [phone]

## 2017-07-08 NOTE — Telephone Encounter (Signed)
Spoke with patient and advised him to go to the ED or urgent care for further evaluation. Pt said that he would go to be evaluated.

## 2017-07-08 NOTE — Telephone Encounter (Signed)
[facility] every [facility]

## 2017-07-08 NOTE — Telephone Encounter (Signed)
Patient Name: Erik Rogers  DOB: [birth year]    Initial Comment Caller says he has a swollen throat on the inside and is visible on the outside. He noticed he has to take deeper breath to breath. He states his mouth and jaw when he tries to open his mouth.    Nurse Assessment  Nurse: Stefano GaulStringer, RN, Dwana CurdVera Date/Time (Eastern Time): 07/08/2017 2:34:39 PM  Confirm and document reason for call. If symptomatic, describe symptoms. ---Caller states he has swelling in his throat and can tell his throat is swollen on the outside. No pain. Hurts where his jaw meets. No fever. he can swallow. symptoms started yesterday. He has to take a deep breath every few breaths because he does not feel like he is getting enough air.  Does the patient have any new or worsening symptoms? ---Yes  Will a triage be completed? ---Yes  Related visit to physician within the last 2 weeks? ---No  Does the PT have any chronic conditions? (i.e. diabetes, asthma, etc.) ---No  Is this a behavioral health or substance abuse call? ---No     Guidelines    Guideline Title Affirmed Question Affirmed Notes  Swallowing Difficulty [1] Swallowing difficulty AND [2] cause unknown (Exception: difficulty swallowing is a chronic symptom)    Final Disposition User   Go to ED Now (or PCP triage) Stefano GaulStringer, RN, Vera    Comments  triage outcome upgraded to go to ER now (or PCP triage) as pt states he has to take deep breath every few breaths as he does not feel like he is getting enough air.   Referrals  GO TO FACILITY OTHER - SPECIFY   Disagree/Comply: Comply

## 2017-11-12 ENCOUNTER — Emergency Department (HOSPITAL_COMMUNITY)
Admission: EM | Admit: 2017-11-12 | Discharge: 2017-11-12 | Disposition: A | Discharge status: Home / Self Care | Payer: Self-pay | Attending: Emergency Medicine | Admitting: Emergency Medicine

## 2017-11-12 ENCOUNTER — Encounter (HOSPITAL_COMMUNITY): Payer: Self-pay

## 2017-11-12 ENCOUNTER — Other Ambulatory Visit: Payer: Self-pay

## 2017-11-12 DIAGNOSIS — Z87891 Personal history of nicotine dependence: Secondary | ICD-10-CM | POA: Insufficient documentation

## 2017-11-12 DIAGNOSIS — Z79899 Other long term (current) drug therapy: Secondary | ICD-10-CM | POA: Insufficient documentation

## 2017-11-12 DIAGNOSIS — K0889 Other specified disorders of teeth and supporting structures: Secondary | ICD-10-CM | POA: Insufficient documentation

## 2017-11-12 MED ORDER — LIDOCAINE VISCOUS 2 % MT SOLN: 15.0000 mL | OROMUCOSAL | 0 refills | Status: DC | PRN

## 2017-11-12 MED ORDER — CLINDAMYCIN HCL 150 MG PO CAPS: 450.0000 mg | ORAL_CAPSULE | Freq: Four times a day (QID) | ORAL | 0 refills | Status: AC

## 2017-11-12 NOTE — ED Triage Notes (Signed)
Per Pt, Pt is coming from home with complaints of dental pain on the right and left secondary to wisdom teeth. Pt reports that it has been inflamed for two days.

## 2017-11-12 NOTE — Discharge Instructions (Signed)
Please read the attached information regarding your condition. Take clindamycin 3 times daily as directed. Swish and spit lidocaine solution as needed.  Do not swallow. Follow-up with your dentist for further evaluation. Return to ED for worsening symptoms, facial swelling, drainage from site, breathing or trouble swallowing.

## 2017-11-12 NOTE — ED Provider Notes (Signed)
[facility] EMERGENCY [facility] Provider Note   CSN: [id number] Arrival date & time: 11/12/17  1258     History   Chief Complaint Chief Complaint  Patient presents with  . Dental Pain    HPI Erik Rogers is a 36 y.o. male who presents to ED for evaluation of right-sided upper and lower dental pain that has been intermittent for the past >7937yrs.  He states that he saw his dentist this past year and was told that he needed his wisdom teeth removed.  Imaging studies were done then.  He is unable to undergo any procedures due to financial issues.  Since then he has had intermittent pain worse with eating.  Describes the pain as sharp and radiates up to his ear.  He has tried over-the-counter Tylenol, Goody powders with mild relief in his symptoms.  He also took 1 dose of clindamycin this morning which she had leftover from prior infection with no relief in his symptoms.  He denies any trismus, drooling, fevers, drainage from site, trauma to area, trouble breathing, trouble swallowing or neck pain.  HPI  Past Medical History:  Diagnosis Date  . Chicken pox   . Frequent headaches   . GERD (gastroesophageal reflux disease)     Patient Active Problem List   Diagnosis Date Noted  . Dental infection 01/29/2017  . Excessive cerumen in both ear canals 01/29/2017  . Tobacco abuse 05/22/2016  . Anxiety and depression 05/22/2016  . RLS (restless legs syndrome) 03/31/2016  . GERD (gastroesophageal reflux disease) 03/31/2016    Past Surgical History:  Procedure Laterality Date  . ADENOIDECTOMY    . FINGER SURGERY     R index  . TONSILLECTOMY         Home Medications    Prior to Admission medications   Medication Sig Start Date End Date Taking? Authorizing Provider  clindamycin (CLEOCIN) 150 MG capsule Take 3 capsules (450 mg total) by mouth every 6 (six) hours for 5 days. 11/12/17 11/17/17  Eldo Umanzor, PA-C  escitalopram (LEXAPRO) 10 MG tablet Take 1  tablet (10 mg total) by mouth daily. 05/21/16   Tommie Samsook, Jayce G, DO  gabapentin (NEURONTIN) 300 MG capsule Take 1 capsule (300 mg total) by mouth at bedtime. 03/30/16   Tommie Samsook, Jayce G, DO  ibuprofen (ADVIL,MOTRIN) 800 MG tablet Take 1 tablet (800 mg total) by mouth every 8 (eight) hours as needed (pain). 04/26/16   Hagler, Jami L, PA-C  lidocaine (XYLOCAINE) 2 % solution Use as directed 15 mLs in the mouth or throat as needed for mouth pain. 11/12/17   Yamira Papa, PA-C  pantoprazole (PROTONIX) 40 MG tablet Take 1 tablet (40 mg total) by mouth daily. 03/30/16   Tommie Samsook, Jayce G, DO  traZODone (DESYREL) 50 MG tablet Take 0.5-1 tablets (25-50 mg total) by mouth at bedtime as needed for sleep. 05/21/16   Tommie Samsook, Jayce G, DO    Family History Family History  Problem Relation Age of Onset  . Hypertension Father   . Thyroid disease Father   . Anemia Brother   . Cancer Brother   . Heart disease Brother   . Stroke Other   . Heart disease Mother   . Stroke Mother     Social History Social History   Tobacco Use  . Smoking status: Former Smoker    Packs/day: 1.00    Types: Cigarettes  . Smokeless tobacco: Never Used  Substance Use Topics  . Alcohol use: No  Alcohol/week: 0.0 oz    Comment: 1 q month  . Drug use: No     Allergies   Penicillins cross reactors   Review of Systems Review of Systems  Constitutional: Negative for appetite change, chills and fever.  HENT: Positive for dental problem. Negative for congestion, drooling, facial swelling, mouth sores, postnasal drip, sinus pressure, sore throat, trouble swallowing and voice change.   Respiratory: Negative for shortness of breath.   Gastrointestinal: Negative for nausea and vomiting.  Musculoskeletal: Negative for neck pain and neck stiffness.  Skin: Negative for rash.     Physical Exam Updated Vital Signs BP (!) 148/97 (BP Location: Right Arm)   Pulse 93   Temp 98.9 F (37.2 C) (Oral)   Resp 18   Ht 6' (1.829 m)   Wt 90.7  kg (200 lb)   SpO2 98%   BMI 27.12 kg/m   Physical Exam  Constitutional: He appears well-developed and well-nourished. No distress.  HENT:  Head: Normocephalic and atraumatic.  Right Ear: Tympanic membrane normal.  Left Ear: Tympanic membrane normal.  Mouth/Throat: Uvula is midline and oropharynx is clear and moist. He does not have dentures. No oral lesions. No trismus in the jaw. Abnormal dentition. Dental caries present. No dental abscesses or uvula swelling. No tonsillar exudate.    There is eruption of bilateral upper and lower wisdom teeth. The bottom right wisdom tooth is decaying. There are other dental caries present. No facial, neck or cheek swelling noted. No visible dental abscess or site needing drainage. No pooling of secretions or trismus.  Normal voice noted with no difficulty swallowing or breathing. No crepitus noted. Floor of mouth is soft and nontender.   Eyes: Conjunctivae and EOM are normal. No scleral icterus.  Neck: Normal range of motion.  Pulmonary/Chest: Effort normal. No respiratory distress.  Neurological: He is alert.  Skin: No rash noted. He is not diaphoretic.  Psychiatric: He has a normal mood and affect.  Nursing note and vitals reviewed.    ED Treatments / Results  Labs (all labs ordered are listed, but only abnormal results are displayed) Labs Reviewed - No data to display  EKG  EKG Interpretation None       Radiology No results found.  Procedures Procedures (including critical care time)  Medications Ordered in ED Medications - No data to display   Initial Impression / Assessment and Plan / ED Course  I have reviewed the triage vital signs and the nursing notes.  Pertinent labs & imaging results that were available during my care of the patient were reviewed by me and considered in my medical decision making (see chart for details).     Patient presents to ED for evaluation of right sided dental pain due to wisdom teeth  eruption that has been intermittent for the past several years.  He reports sharp pain that is worse with eating.  On physical exam patient is overall well-appearing.  He is tolerating secretions with no signs of respiratory distress or airway compromise.  There is no facial swelling dental abscess noted. He is afebrile with no history of fever.  There is overall poor dentition with a decaying right lower wisdom tooth.  He is unable to undergo oral surgery due to financial reasons. I have low suspicion for  Ludwig's angina or other acute deep tissue infection of neck.  I suspect that his symptoms are due to an early dental infection.  Offered patient a dental block but he declined at this  time, and stated that he would instead prefer p.o. medications.  Will give patient antibiotics and advised him to follow-up with his dentist for further evaluation. Patient appears stable for discharge at this time. Strict return precautions given.   Final Clinical Impressions(s) / ED Diagnoses   Final diagnoses:  Pain, dental    ED Discharge Orders        Ordered    clindamycin (CLEOCIN) 150 MG capsule  Every 6 hours     11/12/17 1334    lidocaine (XYLOCAINE) 2 % solution  As needed     11/12/17 1334     Portions of this note were generated with Dragon dictation software. Dictation errors may occur despite best attempts at proofreading.    Dietrich Pates, PA-C 11/12/17 1346    Gerhard Munch, MD 11/14/17 [id number]

## 2017-12-20 ENCOUNTER — Ambulatory Visit: Payer: Self-pay | Admitting: Family Medicine

## 2017-12-21 ENCOUNTER — Other Ambulatory Visit: Payer: Self-pay

## 2017-12-21 ENCOUNTER — Encounter (HOSPITAL_COMMUNITY): Payer: Self-pay | Admitting: Emergency Medicine

## 2017-12-21 ENCOUNTER — Emergency Department (HOSPITAL_COMMUNITY)
Admission: EM | Admit: 2017-12-21 | Discharge: 2017-12-21 | Disposition: A | Discharge status: Home / Self Care | Payer: Self-pay | Attending: Emergency Medicine | Admitting: Emergency Medicine

## 2017-12-21 ENCOUNTER — Ambulatory Visit: Payer: Self-pay | Admitting: Family Medicine

## 2017-12-21 DIAGNOSIS — Z79899 Other long term (current) drug therapy: Secondary | ICD-10-CM | POA: Insufficient documentation

## 2017-12-21 DIAGNOSIS — Z87891 Personal history of nicotine dependence: Secondary | ICD-10-CM | POA: Insufficient documentation

## 2017-12-21 DIAGNOSIS — K0889 Other specified disorders of teeth and supporting structures: Secondary | ICD-10-CM | POA: Insufficient documentation

## 2017-12-21 MED ORDER — CLINDAMYCIN HCL 150 MG PO CAPS: 450.0000 mg | ORAL_CAPSULE | Freq: Three times a day (TID) | ORAL | 0 refills | Status: DC

## 2017-12-21 NOTE — Discharge Instructions (Signed)
Take clindamycin as prescribed with food.  Take probiotics as prescribed over-the-counter with this medication to help protect your stomach bacteria.  Please follow-up with a dentist as soon as possible for further evaluation and treatment.  You can try to contact some of the dental resources below.  [facility]  [employer] [facility] [employer]  [employer] [facility]  [address]  [place], [place] [address]  Phone [phone]

## 2017-12-21 NOTE — ED Triage Notes (Signed)
Onset one month ago developed dental pain given paion medication and antibiotic. States have not seen a dentist yet due to cost. Pain improved overtime however past two days worsening left lower jaw. Pain currently 8/10 achy sharp,

## 2017-12-21 NOTE — ED Provider Notes (Signed)
[facility] EMERGENCY DEPARTMENT Provider Note   CSN: [id number] Arrival date & time: 12/21/17  1348     History   Chief Complaint Chief Complaint  Patient presents with  . Dental Pain    HPI Erik Rogers is a 37 y.o. male who presents with acute on chronic dental pain.  Patient reports he has had dental pain coming and going to his wisdom teeth that he knows he needs to have removed, however has not been able to afford it.  He reports he has had worsening pain over the past 2 days.  He has been taking naproxen with good relief.  He reports he was seen 1 month ago and given clindamycin and symptoms resolved, however he has had return of his symptoms.  He denies any fevers.  He does note that he has had a weird taste in his mouth and possibly drainage from the left lower wisdom tooth.  HPI  Past Medical History:  Diagnosis Date  . Chicken pox   . Frequent headaches   . GERD (gastroesophageal reflux disease)     Patient Active Problem List   Diagnosis Date Noted  . Dental infection 01/29/2017  . Excessive cerumen in both ear canals 01/29/2017  . Tobacco abuse 05/22/2016  . Anxiety and depression 05/22/2016  . RLS (restless legs syndrome) 03/31/2016  . GERD (gastroesophageal reflux disease) 03/31/2016    Past Surgical History:  Procedure Laterality Date  . ADENOIDECTOMY    . FINGER SURGERY     R index  . TONSILLECTOMY         Home Medications    Prior to Admission medications   Medication Sig Start Date End Date Taking? Authorizing Provider  clindamycin (CLEOCIN) 150 MG capsule Take 3 capsules (450 mg total) by mouth 3 (three) times daily. 12/21/17   Graylee Arutyunyan, Waylan Boga, PA-C  escitalopram (LEXAPRO) 10 MG tablet Take 1 tablet (10 mg total) by mouth daily. 05/21/16   Tommie Sams, DO  gabapentin (NEURONTIN) 300 MG capsule Take 1 capsule (300 mg total) by mouth at bedtime. 03/30/16   Tommie Sams, DO  ibuprofen (ADVIL,MOTRIN) 800 MG tablet Take  1 tablet (800 mg total) by mouth every 8 (eight) hours as needed (pain). 04/26/16   Hagler, Jami L, PA-C  ipratropium (ATROVENT) 0.03 % nasal spray Place 2 sprays into both nostrils daily as needed for rhinitis.    [provider]  lidocaine (XYLOCAINE) 2 % solution Use as directed 15 mLs in the mouth or throat as needed for mouth pain. 11/12/17   Khatri, Hina, PA-C  pantoprazole (PROTONIX) 40 MG tablet Take 1 tablet (40 mg total) by mouth daily. 03/30/16   Tommie Sams, DO  traZODone (DESYREL) 50 MG tablet Take 0.5-1 tablets (25-50 mg total) by mouth at bedtime as needed for sleep. 05/21/16   Tommie Sams, DO    Family History Family History  Problem Relation Age of Onset  . Hypertension Father   . Thyroid disease Father   . Anemia Brother   . Cancer Brother   . Heart disease Brother   . Stroke Other   . Heart disease Mother   . Stroke Mother     Social History Social History   Tobacco Use  . Smoking status: Former Smoker    Packs/day: 1.00    Types: Cigarettes  . Smokeless tobacco: Never Used  Substance Use Topics  . Alcohol use: No    Alcohol/week: 0.0 oz  Comment: 1 q month  . Drug use: No     Allergies   Penicillins cross reactors   Review of Systems Review of Systems  Constitutional: Negative for fever.  HENT: Positive for dental problem.      Physical Exam Updated Vital Signs BP (!) 150/90   Pulse 84   Temp 97.8 F (36.6 C) (Oral)   Resp 16   Ht 6' (1.829 m)   Wt 90.7 kg (200 lb)   SpO2 98%   BMI 27.12 kg/m   Physical Exam  Constitutional: He appears well-developed and well-nourished. No distress.  HENT:  Head: Normocephalic and atraumatic.  Mouth/Throat: Oropharynx is clear and moist. No trismus in the jaw. No uvula swelling. No oropharyngeal exudate, posterior oropharyngeal edema, posterior oropharyngeal erythema or tonsillar abscesses.    Eyes: Conjunctivae are normal. Pupils are equal, round, and reactive to light. Right eye  exhibits no discharge. Left eye exhibits no discharge. No scleral icterus.  Neck: Normal range of motion. Neck supple. No thyromegaly present.  Cardiovascular: Normal rate, regular rhythm, normal heart sounds and intact distal pulses. Exam reveals no gallop and no friction rub.  No murmur heard. Pulmonary/Chest: Effort normal and breath sounds normal. No stridor. No respiratory distress. He has no wheezes. He has no rales.  Musculoskeletal: He exhibits no edema.  Lymphadenopathy:    He has no cervical adenopathy.  Neurological: He is alert. Coordination normal.  Skin: Skin is warm and dry. No rash noted. He is not diaphoretic. No pallor.  Psychiatric: He has a normal mood and affect.  Nursing note and vitals reviewed.    ED Treatments / Results  Labs (all labs ordered are listed, but only abnormal results are displayed) Labs Reviewed - No data to display  EKG  EKG Interpretation None       Radiology No results found.  Procedures Procedures (including critical care time)  Medications Ordered in ED Medications - No data to display   Initial Impression / Assessment and Plan / ED Course  I have reviewed the triage vital signs and the nursing notes.  Pertinent labs & imaging results that were available during my care of the patient were reviewed by me and considered in my medical decision making (see chart for details).     Patient with dentalgia.  No abscess requiring immediate incision and drainage.  Exam not concerning for Ludwig's angina or pharyngeal abscess.  Will treat with clindamycin.  I advised patient to take with probiotic over-the-counter.  Pt instructed to follow-up with dentist.  Patient given several resources.  Discussed return precautions.  Patient understands and agrees with plan.  Patient vitals stable throughout ED course and discharged in satisfactory condition.  Final Clinical Impressions(s) / ED Diagnoses   Final diagnoses:  Pain, dental    ED  Discharge Orders        Ordered    clindamycin (CLEOCIN) 150 MG capsule  3 times daily     12/21/17 [address]1537       Kalliopi Coupland, RossfordAlexandra M, PA-C 12/21/17 1729    Nira Connardama, Pedro Eduardo, MD 12/22/17 [phone]

## 2017-12-24 ENCOUNTER — Other Ambulatory Visit: Payer: Self-pay

## 2017-12-24 ENCOUNTER — Ambulatory Visit: Payer: Self-pay | Admitting: Physician Assistant

## 2017-12-24 ENCOUNTER — Encounter (HOSPITAL_COMMUNITY): Payer: Self-pay | Admitting: Emergency Medicine

## 2017-12-24 DIAGNOSIS — Z79899 Other long term (current) drug therapy: Secondary | ICD-10-CM | POA: Insufficient documentation

## 2017-12-24 DIAGNOSIS — K219 Gastro-esophageal reflux disease without esophagitis: Secondary | ICD-10-CM | POA: Insufficient documentation

## 2017-12-24 DIAGNOSIS — Z87891 Personal history of nicotine dependence: Secondary | ICD-10-CM | POA: Insufficient documentation

## 2017-12-24 DIAGNOSIS — K047 Periapical abscess without sinus: Secondary | ICD-10-CM | POA: Insufficient documentation

## 2017-12-24 DIAGNOSIS — K029 Dental caries, unspecified: Secondary | ICD-10-CM | POA: Insufficient documentation

## 2017-12-24 DIAGNOSIS — F418 Other specified anxiety disorders: Secondary | ICD-10-CM | POA: Insufficient documentation

## 2017-12-24 DIAGNOSIS — K122 Cellulitis and abscess of mouth: Principal | ICD-10-CM | POA: Insufficient documentation

## 2017-12-24 DIAGNOSIS — D72829 Elevated white blood cell count, unspecified: Secondary | ICD-10-CM | POA: Insufficient documentation

## 2017-12-24 DIAGNOSIS — Z880 Allergy status to penicillin: Secondary | ICD-10-CM | POA: Insufficient documentation

## 2017-12-24 LAB — BASIC METABOLIC PANEL
Anion gap: 12 (ref 5–15)
CALCIUM: 9 mg/dL (ref 8.9–10.3)
CO2: 26 mmol/L (ref 22–32)
CREATININE: 0.98 mg/dL (ref 0.61–1.24)
Chloride: 101 mmol/L (ref 101–111)
GFR calc Af Amer: >60 mL/min (ref >60)
Glucose, Bld: 92 mg/dL (ref 65–99)
POTASSIUM: 3.8 mmol/L (ref 3.5–5.1)
SODIUM: 139 mmol/L (ref 135–145)

## 2017-12-24 LAB — I-STAT CG4 LACTIC ACID, ED: Lactic Acid, Venous: 0.69 mmol/L (ref 0.5–1.9)

## 2017-12-24 LAB — CBC
HCT: 40.4 % (ref 39.0–52.0)
Hemoglobin: 13.6 g/dL (ref 13.0–17.0)
MCH: 31.1 pg (ref 26.0–34.0)
MCHC: 33.7 g/dL (ref 30.0–36.0)
MCV: 92.2 fL (ref 78.0–100.0)
Platelets: 259 10*3/uL (ref 150–400)
RBC: 4.38 MIL/uL (ref 4.22–5.81)
RDW: 12.8 % (ref 11.5–15.5)
WBC: 13.2 10*3/uL — ABNORMAL HIGH (ref 4.0–10.5)

## 2017-12-24 NOTE — ED Notes (Signed)
Pt requesting pain medication.  

## 2017-12-24 NOTE — ED Triage Notes (Signed)
Pt reports increased pain and swelling to L mouth and radiating down L neck. States [facility] Tuesday [facility] but has been experiencing increased pain and swelling since. Has been taking naproxen for pain with no relief. Reports issues tolerating oral fluids, unable to open mouth very wide for assessment. States increased saliva production, no drooling in triage, able to tolerate oral secretions. Airway intact. Hx tonsillectomy.

## 2017-12-25 ENCOUNTER — Observation Stay (HOSPITAL_COMMUNITY): Payer: Self-pay | Admitting: Anesthesiology

## 2017-12-25 ENCOUNTER — Encounter (HOSPITAL_COMMUNITY): Payer: Self-pay | Admitting: Family Medicine

## 2017-12-25 ENCOUNTER — Other Ambulatory Visit: Payer: Self-pay

## 2017-12-25 ENCOUNTER — Encounter (HOSPITAL_COMMUNITY)
Admission: EM | Disposition: A | Discharge status: Home / Self Care | Payer: Self-pay | Source: Home / Self Care | Attending: Emergency Medicine

## 2017-12-25 ENCOUNTER — Emergency Department (HOSPITAL_COMMUNITY): Payer: Self-pay

## 2017-12-25 ENCOUNTER — Observation Stay (HOSPITAL_COMMUNITY)
Admission: EM | Admit: 2017-12-25 | Discharge: 2017-12-26 | Disposition: A | Discharge status: Home / Self Care | Payer: Self-pay | Attending: Family Medicine | Admitting: Family Medicine

## 2017-12-25 DIAGNOSIS — F329 Major depressive disorder, single episode, unspecified: Secondary | ICD-10-CM

## 2017-12-25 DIAGNOSIS — K122 Cellulitis and abscess of mouth: Secondary | ICD-10-CM

## 2017-12-25 DIAGNOSIS — K047 Periapical abscess without sinus: Secondary | ICD-10-CM | POA: Diagnosis present

## 2017-12-25 DIAGNOSIS — F419 Anxiety disorder, unspecified: Secondary | ICD-10-CM

## 2017-12-25 HISTORY — PX: TOOTH EXTRACTION: SHX859

## 2017-12-25 LAB — SURGICAL PCR SCREEN
MRSA, PCR: NEGATIVE
STAPHYLOCOCCUS AUREUS: NEGATIVE

## 2017-12-25 LAB — HIV ANTIBODY (ROUTINE TESTING W REFLEX): HIV SCREEN 4TH GENERATION: NONREACTIVE

## 2017-12-25 SURGERY — EXTRACTION, TOOTH, MOLAR
Anesthesia: General | Site: Mouth | Laterality: Left

## 2017-12-25 MED ORDER — PHENYLEPHRINE 40 MCG/ML (10ML) SYRINGE FOR IV PUSH (FOR BLOOD PRESSURE SUPPORT)
PREFILLED_SYRINGE | INTRAVENOUS | Status: AC
  Filled 2017-12-25: qty 10

## 2017-12-25 MED ORDER — 0.9 % SODIUM CHLORIDE (POUR BTL) OPTIME
TOPICAL | Status: DC | PRN
  Administered 2017-12-25: 1000 mL

## 2017-12-25 MED ORDER — ROCURONIUM BROMIDE 10 MG/ML (PF) SYRINGE
PREFILLED_SYRINGE | INTRAVENOUS | Status: AC
  Filled 2017-12-25: qty 5

## 2017-12-25 MED ORDER — MIDAZOLAM HCL 2 MG/2ML IJ SOLN
INTRAMUSCULAR | Status: AC
  Administered 2017-12-25: 2 mg via INTRAVENOUS
  Filled 2017-12-25: qty 2

## 2017-12-25 MED ORDER — IBUPROFEN 100 MG/5ML PO SUSP
600.0000 mg | Freq: Four times a day (QID) | ORAL | Status: DC
  Administered 2017-12-25 – 2017-12-26 (×4): 600 mg via ORAL
  Filled 2017-12-25 (×5): qty 30

## 2017-12-25 MED ORDER — SENNOSIDES-DOCUSATE SODIUM 8.6-50 MG PO TABS: 1.0000 | ORAL_TABLET | Freq: Every evening | ORAL | Status: DC | PRN

## 2017-12-25 MED ORDER — CIPROFLOXACIN IN D5W 400 MG/200ML IV SOLN
400.0000 mg | Freq: Two times a day (BID) | INTRAVENOUS | Status: DC
  Administered 2017-12-25 – 2017-12-26 (×3): 400 mg via INTRAVENOUS
  Filled 2017-12-25 (×5): qty 200

## 2017-12-25 MED ORDER — BUPIVACAINE-EPINEPHRINE 0.5% -1:200000 IJ SOLN
INTRAMUSCULAR | Status: DC | PRN
  Administered 2017-12-25: 15 mL

## 2017-12-25 MED ORDER — ONDANSETRON HCL 4 MG/2ML IJ SOLN
INTRAMUSCULAR | Status: DC | PRN
  Administered 2017-12-25: 4 mg via INTRAVENOUS

## 2017-12-25 MED ORDER — DEXAMETHASONE SODIUM PHOSPHATE 10 MG/ML IJ SOLN
INTRAMUSCULAR | Status: AC
  Filled 2017-12-25: qty 1

## 2017-12-25 MED ORDER — OXYMETAZOLINE HCL 0.05 % NA SOLN
NASAL | Status: AC
  Administered 2017-12-25: 2 via NASAL
  Filled 2017-12-25: qty 15

## 2017-12-25 MED ORDER — HYDROMORPHONE HCL 1 MG/ML IJ SOLN
0.5000 mg | INTRAMUSCULAR | Status: DC | PRN
  Administered 2017-12-25: 1 mg via INTRAVENOUS
  Filled 2017-12-25: qty 1

## 2017-12-25 MED ORDER — GABAPENTIN 300 MG PO CAPS
300.0000 mg | ORAL_CAPSULE | Freq: Every day | ORAL | Status: DC
  Administered 2017-12-25: 300 mg via ORAL
  Filled 2017-12-25: qty 1

## 2017-12-25 MED ORDER — CLINDAMYCIN PHOSPHATE 600 MG/50ML IV SOLN
600.0000 mg | Freq: Once | INTRAVENOUS | Status: AC
  Administered 2017-12-25: 600 mg via INTRAVENOUS
  Filled 2017-12-25: qty 50

## 2017-12-25 MED ORDER — OXYMETAZOLINE HCL 0.05 % NA SOLN
2.0000 | Freq: Once | NASAL | Status: AC
  Administered 2017-12-25: 2 via NASAL

## 2017-12-25 MED ORDER — HYDROCODONE-ACETAMINOPHEN 7.5-325 MG/15ML PO SOLN
10.0000 mL | ORAL | Status: DC | PRN
  Administered 2017-12-25 – 2017-12-26 (×4): 10 mL via ORAL
  Filled 2017-12-25 (×5): qty 15

## 2017-12-25 MED ORDER — MIDAZOLAM HCL 2 MG/2ML IJ SOLN
INTRAMUSCULAR | Status: AC
  Filled 2017-12-25: qty 2

## 2017-12-25 MED ORDER — GLYCOPYRROLATE 0.2 MG/ML IJ SOLN
0.1000 mg | Freq: Once | INTRAMUSCULAR | Status: AC
  Administered 2017-12-25: 0.1 mg via INTRAVENOUS

## 2017-12-25 MED ORDER — OXYCODONE HCL 5 MG/5ML PO SOLN: 5.0000 mg | Freq: Once | ORAL | Status: DC | PRN

## 2017-12-25 MED ORDER — HYDROMORPHONE HCL 1 MG/ML IJ SOLN
0.5000 mg | Freq: Once | INTRAMUSCULAR | Status: AC
  Administered 2017-12-25: 0.5 mg via INTRAVENOUS
  Filled 2017-12-25: qty 1

## 2017-12-25 MED ORDER — ONDANSETRON HCL 4 MG PO TABS: 4.0000 mg | ORAL_TABLET | Freq: Four times a day (QID) | ORAL | Status: DC | PRN

## 2017-12-25 MED ORDER — CHLORHEXIDINE GLUCONATE 0.12 % MT SOLN
15.0000 mL | Freq: Four times a day (QID) | OROMUCOSAL | Status: DC
  Administered 2017-12-25 – 2017-12-26 (×5): 15 mL via OROMUCOSAL
  Filled 2017-12-25 (×4): qty 15

## 2017-12-25 MED ORDER — EPHEDRINE 5 MG/ML INJ
INTRAVENOUS | Status: AC
  Filled 2017-12-25: qty 10

## 2017-12-25 MED ORDER — DEXAMETHASONE SODIUM PHOSPHATE 10 MG/ML IJ SOLN
INTRAMUSCULAR | Status: DC | PRN
  Administered 2017-12-25: 10 mg via INTRAVENOUS

## 2017-12-25 MED ORDER — KCL IN DEXTROSE-NACL 20-5-0.45 MEQ/L-%-% IV SOLN
INTRAVENOUS | Status: AC
  Administered 2017-12-25: 07:00:00 via INTRAVENOUS
  Filled 2017-12-25 (×2): qty 1000

## 2017-12-25 MED ORDER — MIDAZOLAM HCL 2 MG/2ML IJ SOLN
2.0000 mg | Freq: Once | INTRAMUSCULAR | Status: AC
  Administered 2017-12-25: 2 mg via INTRAVENOUS

## 2017-12-25 MED ORDER — LIDOCAINE HCL (CARDIAC) 20 MG/ML IV SOLN
INTRAVENOUS | Status: DC | PRN
  Administered 2017-12-25: 100 mg via INTRAVENOUS

## 2017-12-25 MED ORDER — LIDOCAINE-EPINEPHRINE 2 %-1:100000 IJ SOLN
INTRAMUSCULAR | Status: DC | PRN
  Administered 2017-12-25: 15 mL

## 2017-12-25 MED ORDER — ONDANSETRON HCL 4 MG/2ML IJ SOLN
4.0000 mg | Freq: Four times a day (QID) | INTRAMUSCULAR | Status: DC | PRN
  Administered 2017-12-25: 4 mg via INTRAVENOUS
  Filled 2017-12-25: qty 2

## 2017-12-25 MED ORDER — FENTANYL CITRATE (PF) 250 MCG/5ML IJ SOLN
INTRAMUSCULAR | Status: AC
  Filled 2017-12-25: qty 5

## 2017-12-25 MED ORDER — ESCITALOPRAM OXALATE 10 MG PO TABS
10.0000 mg | ORAL_TABLET | Freq: Every day | ORAL | Status: DC
  Filled 2017-12-25 (×2): qty 1

## 2017-12-25 MED ORDER — ACETAMINOPHEN 325 MG PO TABS
650.0000 mg | ORAL_TABLET | Freq: Four times a day (QID) | ORAL | Status: DC | PRN
  Administered 2017-12-25: 650 mg via ORAL
  Filled 2017-12-25: qty 2

## 2017-12-25 MED ORDER — PROMETHAZINE HCL 25 MG/ML IJ SOLN: 6.2500 mg | INTRAMUSCULAR | Status: DC | PRN

## 2017-12-25 MED ORDER — PROPOFOL 10 MG/ML IV BOLUS
INTRAVENOUS | Status: AC
  Filled 2017-12-25: qty 20

## 2017-12-25 MED ORDER — HYDROMORPHONE HCL 1 MG/ML IJ SOLN
0.2500 mg | INTRAMUSCULAR | Status: DC | PRN
  Administered 2017-12-25 (×2): 0.5 mg via INTRAVENOUS

## 2017-12-25 MED ORDER — ACETAMINOPHEN 650 MG RE SUPP: 650.0000 mg | Freq: Four times a day (QID) | RECTAL | Status: DC | PRN

## 2017-12-25 MED ORDER — HYDROMORPHONE HCL 1 MG/ML IJ SOLN
INTRAMUSCULAR | Status: AC
  Filled 2017-12-25: qty 1

## 2017-12-25 MED ORDER — BUPIVACAINE-EPINEPHRINE (PF) 0.5% -1:200000 IJ SOLN
INTRAMUSCULAR | Status: AC
  Filled 2017-12-25: qty 30

## 2017-12-25 MED ORDER — PANTOPRAZOLE SODIUM 40 MG PO TBEC
40.0000 mg | DELAYED_RELEASE_TABLET | Freq: Every day | ORAL | Status: DC
  Administered 2017-12-25 – 2017-12-26 (×2): 40 mg via ORAL
  Filled 2017-12-25 (×2): qty 1

## 2017-12-25 MED ORDER — SUCCINYLCHOLINE CHLORIDE 20 MG/ML IJ SOLN
INTRAMUSCULAR | Status: DC | PRN
  Administered 2017-12-25: 80 mg via INTRAVENOUS

## 2017-12-25 MED ORDER — OXYCODONE HCL 5 MG PO TABS: 5.0000 mg | ORAL_TABLET | Freq: Once | ORAL | Status: DC | PRN

## 2017-12-25 MED ORDER — GLYCOPYRROLATE 0.2 MG/ML IJ SOLN
INTRAMUSCULAR | Status: AC
  Administered 2017-12-25: 0.1 mg via INTRAVENOUS
  Filled 2017-12-25: qty 1

## 2017-12-25 MED ORDER — MORPHINE SULFATE (PF) 4 MG/ML IV SOLN
4.0000 mg | Freq: Once | INTRAVENOUS | Status: AC
  Administered 2017-12-25: 4 mg via INTRAVENOUS
  Filled 2017-12-25: qty 1

## 2017-12-25 MED ORDER — METRONIDAZOLE IN NACL 5-0.79 MG/ML-% IV SOLN
500.0000 mg | Freq: Three times a day (TID) | INTRAVENOUS | Status: DC
  Administered 2017-12-25 – 2017-12-26 (×4): 500 mg via INTRAVENOUS
  Filled 2017-12-25 (×6): qty 100

## 2017-12-25 MED ORDER — PROPOFOL 10 MG/ML IV BOLUS
INTRAVENOUS | Status: AC
Start: 2017-12-25 — End: 2017-12-25
  Filled 2017-12-25: qty 20

## 2017-12-25 MED ORDER — TRAZODONE HCL 50 MG PO TABS: 25.0000 mg | ORAL_TABLET | Freq: Every evening | ORAL | Status: DC | PRN

## 2017-12-25 MED ORDER — IOPAMIDOL (ISOVUE-300) INJECTION 61%
INTRAVENOUS | Status: AC
  Administered 2017-12-25: 75 mL
  Filled 2017-12-25: qty 75

## 2017-12-25 MED ORDER — SUCCINYLCHOLINE CHLORIDE 200 MG/10ML IV SOSY
PREFILLED_SYRINGE | INTRAVENOUS | Status: AC
  Filled 2017-12-25: qty 20

## 2017-12-25 MED ORDER — LACTATED RINGERS IV SOLN
INTRAVENOUS | Status: DC
  Administered 2017-12-25 (×2): via INTRAVENOUS

## 2017-12-25 MED ORDER — METRONIDAZOLE IN NACL 5-0.79 MG/ML-% IV SOLN
500.0000 mg | Freq: Three times a day (TID) | INTRAVENOUS | Status: DC
  Administered 2017-12-25: 500 mg via INTRAVENOUS
  Filled 2017-12-25 (×2): qty 100

## 2017-12-25 MED ORDER — BISACODYL 5 MG PO TBEC: 5.0000 mg | DELAYED_RELEASE_TABLET | Freq: Every day | ORAL | Status: DC | PRN

## 2017-12-25 MED ORDER — PROPOFOL 10 MG/ML IV BOLUS
INTRAVENOUS | Status: DC | PRN
  Administered 2017-12-25: 40 mg via INTRAVENOUS
  Administered 2017-12-25: 200 mg via INTRAVENOUS

## 2017-12-25 MED ORDER — ONDANSETRON HCL 4 MG/2ML IJ SOLN
INTRAMUSCULAR | Status: AC
  Filled 2017-12-25: qty 2

## 2017-12-25 MED ORDER — FENTANYL CITRATE (PF) 100 MCG/2ML IJ SOLN
INTRAMUSCULAR | Status: DC | PRN
  Administered 2017-12-25: 50 ug via INTRAVENOUS
  Administered 2017-12-25: 100 ug via INTRAVENOUS
  Administered 2017-12-25 (×2): 50 ug via INTRAVENOUS

## 2017-12-25 MED ORDER — LIDOCAINE 2% (20 MG/ML) 5 ML SYRINGE
INTRAMUSCULAR | Status: AC
  Filled 2017-12-25: qty 5

## 2017-12-25 MED ORDER — LIDOCAINE-EPINEPHRINE 2 %-1:100000 IJ SOLN
INTRAMUSCULAR | Status: AC
  Filled 2017-12-25: qty 1

## 2017-12-25 MED ORDER — LIDOCAINE-EPINEPHRINE 1 %-1:100000 IJ SOLN
INTRAMUSCULAR | Status: AC
  Filled 2017-12-25: qty 1

## 2017-12-25 SURGICAL SUPPLY — 28 items
BLADE SURG 15 STRL LF DISP TIS (BLADE) ×2 IMPLANT
BLADE SURG 15 STRL SS (BLADE) ×6
CANISTER SUCT 1200ML W/VALVE (MISCELLANEOUS) ×3 IMPLANT
COVER BACK TABLE 60X90IN (DRAPES) ×3 IMPLANT
COVER MAYO STAND STRL (DRAPES) ×3 IMPLANT
DRAPE U-SHAPE 76X120 STRL (DRAPES) ×2 IMPLANT
GAUZE PACKING FOLDED 2  STR (GAUZE/BANDAGES/DRESSINGS) ×2
GAUZE PACKING FOLDED 2 STR (GAUZE/BANDAGES/DRESSINGS) IMPLANT
GAUZE SPONGE 4X4 12PLY STRL (GAUZE/BANDAGES/DRESSINGS) ×2 IMPLANT
GAUZE SPONGE 4X4 16PLY XRAY LF (GAUZE/BANDAGES/DRESSINGS) ×2 IMPLANT
GLOVE ORTHO TXT STRL SZ7.5 (GLOVE) ×3 IMPLANT
GOWN STRL REUS W/ TWL LRG LVL3 (GOWN DISPOSABLE) ×3 IMPLANT
GOWN STRL REUS W/TWL LRG LVL3 (GOWN DISPOSABLE) ×3
KIT BASIN OR (CUSTOM PROCEDURE TRAY) ×3 IMPLANT
NDL DENTAL 27 LONG (NEEDLE) ×1 IMPLANT
NEEDLE 22X1 1/2 (OR ONLY) (NEEDLE) ×3 IMPLANT
NEEDLE DENTAL 27 LONG (NEEDLE) ×3 IMPLANT
NS IRRIG 1000ML POUR BTL (IV SOLUTION) ×3 IMPLANT
SUT CHROMIC 3 0 PS 2 (SUTURE) ×2 IMPLANT
SUT CHROMIC 4 0 PS 2 18 (SUTURE) ×3 IMPLANT
SUT SILK 3 0 PS 1 (SUTURE) ×2 IMPLANT
SWAB COLLECTION DEVICE MRSA (MISCELLANEOUS) ×2 IMPLANT
SWAB CULTURE ESWAB REG 1ML (MISCELLANEOUS) ×2 IMPLANT
SYR 50ML LL SCALE MARK (SYRINGE) ×3 IMPLANT
TAPE CLOTH SURG 4X10 WHT LF (GAUZE/BANDAGES/DRESSINGS) ×2 IMPLANT
TUBE CONNECTING 20'X1/4 (TUBING) ×1
TUBE CONNECTING 20X1/4 (TUBING) ×2 IMPLANT
YANKAUER SUCT BULB TIP NO VENT (SUCTIONS) ×3 IMPLANT

## 2017-12-25 NOTE — ED Notes (Addendum)
Pt c/o sweating and vomited - Zofran given. Pt c/o feeling as if throat is swelling more. Pt able to speak in full sentences and denies any difficulty breathing. States "It's probably my anxiety, too". Pt's temp decreased from 100.0 to 98.9.

## 2017-12-25 NOTE — ED Notes (Signed)
Significant other lying in bed w/pt. Pt aware NPO and waiting for surgery this am.

## 2017-12-25 NOTE — H&P (Signed)
History and Physical    Erik Rogers ZOX:[id number] DOB: Aug 16, 1981 DOA: 12/25/2017  PCP: Tommie Samsook, Jayce G, DO   Patient coming from: Home  Chief Complaint: Pain and swelling at left side of mouth   HPI: Erik Rogers is a 37 y.o. male with medical history significant for depression with anxiety, now presenting to the emergency department for evaluation of worsening pain and swelling at his left jaw.  Patient reports that he developed toothache a little over a week ago, began to develop some swelling, was evaluated in the ED on 12/21/2017 and started on clindamycin, but has experienced increasing pain and swelling despite this.  He reports his pain to be severe, localized to the left jaw, constant, worse with movement of his mouth, and with no alleviating factors identified.  Denies any shortness of breath or difficulty swallowing.  ED Course: Upon arrival to the ED, patient is found to be afebrile, saturating well on room air, and with vitals otherwise normal.  Chemistry panel is unremarkable and CBC is notable for a leukocytosis to 13,200.  Lactic acid is reassuringly normal.  CT reveals an abscess of the left retromolar trigone measuring 3.4 x 1.8 x 2.5 cm and arising from a large dental carry.  There is no retropharyngeal abscess or extension into the neck noted on CT. Dr. Kenney Housemanrab of oral surgery was consulted by the ED physician and recommended medical admission with IV abx and indicates that he will plan to see the patient in the morning for abscess drainage.   Review of Systems:  All other systems reviewed and apart from HPI, are negative.  Past Medical History:  Diagnosis Date  . Chicken pox   . Frequent headaches   . GERD (gastroesophageal reflux disease)     Past Surgical History:  Procedure Laterality Date  . ADENOIDECTOMY    . FINGER SURGERY     R index  . TONSILLECTOMY       reports that he has quit smoking. His smoking use included cigarettes. He smoked 1.00 pack  per day. he has never used smokeless tobacco. He reports that he does not drink alcohol or use drugs.  Allergies  Allergen Reactions  . Penicillins Cross Reactors Other (See Comments)    Unknown     Family History  Problem Relation Age of Onset  . Hypertension Father   . Thyroid disease Father   . Anemia Brother   . Cancer Brother   . Heart disease Brother   . Stroke Other   . Heart disease Mother   . Stroke Mother      Prior to Admission medications   Medication Sig Start Date End Date Taking? Authorizing Provider  clindamycin (CLEOCIN) 150 MG capsule Take 3 capsules (450 mg total) by mouth 3 (three) times daily. 12/21/17   Law, Waylan BogaAlexandra M, PA-C  escitalopram (LEXAPRO) 10 MG tablet Take 1 tablet (10 mg total) by mouth daily. 05/21/16   Tommie Samsook, Jayce G, DO  gabapentin (NEURONTIN) 300 MG capsule Take 1 capsule (300 mg total) by mouth at bedtime. 03/30/16   Tommie Samsook, Jayce G, DO  ibuprofen (ADVIL,MOTRIN) 800 MG tablet Take 1 tablet (800 mg total) by mouth every 8 (eight) hours as needed (pain). 04/26/16   Hagler, Jami L, PA-C  ipratropium (ATROVENT) 0.03 % nasal spray Place 2 sprays into both nostrils daily as needed for rhinitis.    [provider]  lidocaine (XYLOCAINE) 2 % solution Use as directed 15 mLs in the mouth or throat  as needed for mouth pain. 11/12/17   Khatri, Hina, PA-C  pantoprazole (PROTONIX) 40 MG tablet Take 1 tablet (40 mg total) by mouth daily. 03/30/16   Tommie Sams, DO  traZODone (DESYREL) 50 MG tablet Take 0.5-1 tablets (25-50 mg total) by mouth at bedtime as needed for sleep. 05/21/16   Tommie Sams, DO    Physical Exam: Vitals:   12/24/17 1956 12/24/17 2251 12/25/17 0056 12/25/17 0315  BP:  140/78 (!) 141/86 (!) 149/83  Pulse:  70 80 75  Resp:  18 18   Temp:  98.4 F (36.9 C) 98.7 F (37.1 C) 98.5 F (36.9 C)  TempSrc:  Oral Oral Oral  SpO2:  98% 98% 99%  Weight: 90.7 kg (200 lb)     Height: 6' (1.829 m)         Constitutional: NAD, calm,  appears uncomfortable Eyes: PERTLA, lids and conjunctivae normal ENMT: Mucous membranes are moist. Severe pain with jaw movement limits exam.   Neck: normal, supple, no masses, no thyromegaly Respiratory: clear to auscultation bilaterally, no wheezing, no crackles. No accessory muscle use.  Cardiovascular: S1 & S2 heard, regular rate and rhythm. No significant JVD. Abdomen: No distension, no tenderness, no masses palpated. Bowel sounds normal.  Musculoskeletal: no clubbing / cyanosis. No joint deformity upper and lower extremities.   Skin: no significant rashes, lesions, ulcers. Warm, dry, well-perfused. Neurologic: CN 2-12 grossly intact. Sensation intact. Strength 5/5 in all 4 limbs.  Psychiatric: Alert and oriented x 3. Pleasant and cooperative.     Labs on Admission: I have personally reviewed following labs and imaging studies  CBC: Recent Labs  Lab 12/24/17 1954  WBC 13.2*  HGB 13.6  HCT 40.4  MCV 92.2  PLT 259   Basic Metabolic Panel: Recent Labs  Lab 12/24/17 1954  NA 139  K 3.8  CL 101  CO2 26  GLUCOSE 92  BUN <5*  CREATININE 0.98  CALCIUM 9.0   GFR: Estimated Creatinine Clearance: 115.5 mL/min (by C-G formula based on SCr of 0.98 mg/dL). Liver Function Tests: No results for input(s): AST, ALT, ALKPHOS, BILITOT, PROT, ALBUMIN in the last 168 hours. No results for input(s): LIPASE, AMYLASE in the last 168 hours. No results for input(s): AMMONIA in the last 168 hours. Coagulation Profile: No results for input(s): INR, PROTIME in the last 168 hours. Cardiac Enzymes: No results for input(s): CKTOTAL, CKMB, CKMBINDEX, TROPONINI in the last 168 hours. BNP (last 3 results) No results for input(s): PROBNP in the last 8760 hours. HbA1C: No results for input(s): HGBA1C in the last 72 hours. CBG: No results for input(s): GLUCAP in the last 168 hours. Lipid Profile: No results for input(s): CHOL, HDL, LDLCALC, TRIG, CHOLHDL, LDLDIRECT in the last 72  hours. Thyroid Function Tests: No results for input(s): TSH, T4TOTAL, FREET4, T3FREE, THYROIDAB in the last 72 hours. Anemia Panel: No results for input(s): VITAMINB12, FOLATE, FERRITIN, TIBC, IRON, RETICCTPCT in the last 72 hours. Urine analysis: No results found for: COLORURINE, APPEARANCEUR, LABSPEC, PHURINE, GLUCOSEU, HGBUR, BILIRUBINUR, KETONESUR, PROTEINUR, UROBILINOGEN, NITRITE, LEUKOCYTESUR Sepsis Labs: @LABRCNTIP (procalcitonin:4,lacticidven:4) )No results found for this or any previous visit (from the past 240 hour(s)).   Radiological Exams on Admission: Ct Soft Tissue Neck W Contrast  Result Date: 12/25/2017 CLINICAL DATA:  Left mouth swelling radiating into the left neck. EXAM: CT NECK WITH CONTRAST TECHNIQUE: Multidetector CT imaging of the neck was performed using the standard protocol following the bolus administration of intravenous contrast. CONTRAST:  75mL ISOVUE-300 IOPAMIDOL (  ISOVUE-300) INJECTION 61% COMPARISON:  None. FINDINGS: Pharynx and larynx: --Nasopharynx: Fossae of Rosenmuller are clear. Normal adenoid tonsils for age. --Oral cavity and oropharynx: There is an abscess at the left retromolar trigone measuring 3.4 x 1.8 x 2.5 cm. There is mild inflammatory change of the adjacent fat. There is a large carie at the surface of tooth 17, with a medium-sized periapical lucency at the root. --Hypopharynx: Normal vallecula and pyriform sinuses. --Larynx: Normal epiglottis and pre-epiglottic space. Normal aryepiglottic and vocal folds. --Retropharyngeal space: No abscess, effusion or lymphadenopathy. Salivary glands: --Parotid: No mass lesion or inflammation. No sialolithiasis or ductal dilatation. --Submandibular: Symmetric without inflammation. No sialolithiasis or ductal dilatation. --Sublingual: Normal. No ranula or other visible lesion of the base of tongue and floor of mouth. Thyroid: Normal. Lymph nodes: Left level 1B lymph node measures 7 mm. Left level 2A lymph node measures  8 mm. No abnormal density lymph nodes. Vascular: Major cervical vessels are patent. Limited intracranial: Normal. Visualized orbits: Normal. Mastoids and visualized paranasal sinuses: No fluid levels or advanced mucosal thickening. No mastoid effusion. Skeleton: No bony spinal canal stenosis. No lytic or blastic lesions. Upper chest: Clear. Other: None. IMPRESSION: 1. Abscess of the left retromolar trigone measuring 3.4 x 1.8 x 2.5 cm, arising secondary to large dental carie and medium-sized periapical lucency at tooth 17. 2. There are a few borderline enlarged reactive left cervical lymph nodes, but no retropharyngeal abscess or other extension into the lower neck. Electronically Signed   By: Deatra Robinson M.D.   On: 12/25/2017 02:33    EKG: not performed.   Assessment/Plan  1. Dental abscess  - Presents with increasing pain and swelling at left jaw, worsening despite 4 days of clindamycin  - He is afebrile and hemodynamically stable, noted to have leukocytosis, and CT with left retromolar abscess arising from large dental carrie  - He is managing secretions and there is no airway compromise  - Oral surgeon, Dr. Kenney Houseman, was consulted, recommends IV abx and will plan to see pt in am for drainage  - Patient has worsened on clindamycin and reports penicillin allergy; will treat with empiric ciprofloxacin and Flagyl  - Continue supportive care with prn analgesia    2. Depression with anxiety  - Stable, continue Lexapro and trazodone     DVT prophylaxis: SCD's Code Status: Full  Family Communication: Discussed with patient Disposition Plan: Observe on med-surg Consults called: Transport planner, Dr. Kenney Houseman  Admission status: Observation    Briscoe Deutscher, MD [facility] Pager [phone]  If 7PM-7AM, please contact night-coverage [email or web address] Password [email or web address]  12/25/2017, 4:08 AM

## 2017-12-25 NOTE — Anesthesia Procedure Notes (Signed)
Procedure Name: Intubation Date/Time: 12/25/2017 12:50 PM Performed by: Fransisca KaufmannMeyer, Damyra Luscher E, CRNA Pre-anesthesia Checklist: Patient identified, Emergency Drugs available, Suction available and Patient being monitored Patient Re-evaluated:Patient Re-evaluated prior to induction Oxygen Delivery Method: Circle System Utilized Preoxygenation: Pre-oxygenation with 100% oxygen Induction Type: IV induction Ventilation: Mask ventilation without difficulty Laryngoscope Size: Glidescope and 4 Grade View: Grade I Nasal Tubes: Nasal Rae, Nasal prep performed and Magill forceps - small, utilized Tube size: 7.0 (7.5 attempted/slight insertion while awake/too large/7.0 Nrae placed with ease) mm Number of attempts: 1 Airway Equipment and Method: Video-laryngoscopy Placement Confirmation: ETT inserted through vocal cords under direct vision,  positive ETCO2 and breath sounds checked- equal and bilateral Tube secured with: Tape Dental Injury: Teeth and Oropharynx as per pre-operative assessment  Comments: Nasal rae placed with assist of glide scope and magills/small

## 2017-12-25 NOTE — Consult Note (Signed)
Reason for Consult: odontogenic abscess Referring Physician: ED  Erik Rogers is an 37 y.o. male with a history of poor dentition presents to the emergency department for left lower dental pain with associated facial swelling.  Patient reports that he was evaluated 4 days ago in the emergency department for the same and diagnosed with a dental infection that was treated with clindamycin.  Patient has been taking naproxen and clindamycin as prescribed with improvement to the swelling however the pain has progressively worsen and also has associated trismus.  Pain is exacerbated with palpation, opening his mouth, attempting to chew.  No real alleviating factors.  He denies fevers but endorses chills and diaphoresis.  No dyspnea/dyphagia though induces odynophagia. Patient admitted to the hospitalist service.    Past Medical History:  Diagnosis Date  . Chicken pox   . Frequent headaches   . GERD (gastroesophageal reflux disease)     Past Surgical History:  Procedure Laterality Date  . ADENOIDECTOMY    . FINGER SURGERY     R index  . TONSILLECTOMY      Family History  Problem Relation Age of Onset  . Hypertension Father   . Thyroid disease Father   . Anemia Brother   . Cancer Brother   . Heart disease Brother   . Stroke Other   . Heart disease Mother   . Stroke Mother     Social History:  reports that he has quit smoking. His smoking use included cigarettes. He smoked 1.00 pack per day. he has never used smokeless tobacco. He reports that he does not drink alcohol or use drugs.  Allergies:  Allergies  Allergen Reactions  . Penicillins Cross Reactors Other (See Comments)    Unknown     Medications: I have reviewed the patient's current medications.  Results for orders placed or performed during the hospital encounter of 12/25/17 (from the past 48 hour(s))  CBC     Status: Abnormal   Collection Time: 12/24/17  7:54 PM  Result Value Ref Range   WBC 13.2 (H) 4.0 -  10.5 K/uL   RBC 4.38 4.22 - 5.81 MIL/uL   Hemoglobin 13.6 13.0 - 17.0 g/dL   HCT 40.4 39.0 - 52.0 %   MCV 92.2 78.0 - 100.0 fL   MCH 31.1 26.0 - 34.0 pg   MCHC 33.7 30.0 - 36.0 g/dL   RDW 12.8 11.5 - 15.5 %   Platelets 259 150 - 400 K/uL  Basic metabolic panel     Status: Abnormal   Collection Time: 12/24/17  7:54 PM  Result Value Ref Range   Sodium 139 135 - 145 mmol/L   Potassium 3.8 3.5 - 5.1 mmol/L   Chloride 101 101 - 111 mmol/L   CO2 26 22 - 32 mmol/L   Glucose, Bld 92 65 - 99 mg/dL   BUN <5 (L) 6 - 20 mg/dL   Creatinine, Ser 0.98 0.61 - 1.24 mg/dL   Calcium 9.0 8.9 - 10.3 mg/dL   GFR calc non Af Amer >60 >60 mL/min   GFR calc Af Amer >60 >60 mL/min    Comment: (NOTE) The eGFR has been calculated using the CKD EPI equation. This calculation has not been validated in all clinical situations. eGFR's persistently <60 mL/min signify possible Chronic Kidney Disease.    Anion gap 12 5 - 15  I-Stat CG4 Lactic Acid, ED     Status: None   Collection Time: 12/24/17  8:11 PM  Result Value Ref Range  Lactic Acid, Venous 0.69 0.5 - 1.9 mmol/L  Surgical pcr screen     Status: None   Collection Time: 12/25/17  9:54 AM  Result Value Ref Range   MRSA, PCR NEGATIVE NEGATIVE   Staphylococcus aureus NEGATIVE NEGATIVE    Comment: (NOTE) The Xpert SA Assay (FDA approved for NASAL specimens in patients 32 years of age and older), is one component of a comprehensive surveillance program. It is not intended to diagnose infection nor to guide or monitor treatment.     Ct Soft Tissue Neck W Contrast  Result Date: 12/25/2017 CLINICAL DATA:  Left mouth swelling radiating into the left neck. EXAM: CT NECK WITH CONTRAST TECHNIQUE: Multidetector CT imaging of the neck was performed using the standard protocol following the bolus administration of intravenous contrast. CONTRAST:  42m ISOVUE-300 IOPAMIDOL (ISOVUE-300) INJECTION 61% COMPARISON:  None. FINDINGS: Pharynx and larynx: --Nasopharynx:  Fossae of Rosenmuller are clear. Normal adenoid tonsils for age. --Oral cavity and oropharynx: There is an abscess at the left retromolar trigone measuring 3.4 x 1.8 x 2.5 cm. There is mild inflammatory change of the adjacent fat. There is a large carie at the surface of tooth 17, with a medium-sized periapical lucency at the root. --Hypopharynx: Normal vallecula and pyriform sinuses. --Larynx: Normal epiglottis and pre-epiglottic space. Normal aryepiglottic and vocal folds. --Retropharyngeal space: No abscess, effusion or lymphadenopathy. Salivary glands: --Parotid: No mass lesion or inflammation. No sialolithiasis or ductal dilatation. --Submandibular: Symmetric without inflammation. No sialolithiasis or ductal dilatation. --Sublingual: Normal. No ranula or other visible lesion of the base of tongue and floor of mouth. Thyroid: Normal. Lymph nodes: Left level 1B lymph node measures 7 mm. Left level 2A lymph node measures 8 mm. No abnormal density lymph nodes. Vascular: Major cervical vessels are patent. Limited intracranial: Normal. Visualized orbits: Normal. Mastoids and visualized paranasal sinuses: No fluid levels or advanced mucosal thickening. No mastoid effusion. Skeleton: No bony spinal canal stenosis. No lytic or blastic lesions. Upper chest: Clear. Other: None. IMPRESSION: 1. Abscess of the left retromolar trigone measuring 3.4 x 1.8 x 2.5 cm, arising secondary to large dental carie and medium-sized periapical lucency at tooth 17. 2. There are a few borderline enlarged reactive left cervical lymph nodes, but no retropharyngeal abscess or other extension into the lower neck. Electronically Signed   By: KUlyses JarredM.D.   On: 12/25/2017 02:33    ROS: other than HPI, neg for 10pt review Blood pressure 138/90, pulse 88, temperature 99.7 F (37.6 C), resp. rate 16, height 6' (1.829 m), weight 90.7 kg (200 lb), SpO2 96 %. Physical Exam: Gen: sitting in bed with head elevated, NAD HEENT: mild left  submandibular/cervical edema with tender cervical lymphadenopathy. Severe trimus causing difficult to exam. Able to see multple carious teeth #1,17,32. The visible left soft palate is moderately edematous and the uvula is displaced to the right, unable to visualize the posterior pharynx. Mucous membranes moist.   Assessment: 1. Left submandibular odontogenic abscess 2. Carious, nonrestorable teeth #1,17,32  Plan: The patient will be taken to the operating room and placed under general anesthesia for incision and drainage of left submandibular odontogenic abscess with intraoral/extraoral approaches as well as surgical removal of multiple carious teeth #1, 17, 32 and placement of a surgical drain.  The drain will likely be in place for approximately 2-3 days.  Following procedure recommend continued observation, IV antibiotics and pain management.  If patient tolerates procedure well potential for discharge tomorrow with follow-up in clinic on Monday for  drain removal.  Michael Litter, DMD Oral & Maxillofacial Surgeon 12/25/2017, 2:46 PM

## 2017-12-25 NOTE — Transfer of Care (Signed)
Immediate Anesthesia Transfer of Care Note  Patient: Alain MarionKristopher S Qadri  Procedure(s) Performed: I AND D LEFT SUBMANDIBULAR ABSCESS, WITH MULTIPLE TEETH EXTRACTIONS (Left Mouth)  Patient Location: PACU  Anesthesia Type:General  Level of Consciousness: awake, alert , oriented and sedated  Airway & Oxygen Therapy: Patient Spontanous Breathing  Post-op Assessment: Report given to RN, Post -op Vital signs reviewed and stable and Patient moving all extremities  Post vital signs: Reviewed and stable  Last Vitals:  Vitals:   12/25/17 0903 12/25/17 0925  BP:  140/86  Pulse:  88  Resp:  18  Temp: 37.2 C 36.8 C  SpO2:  97%    Last Pain:  Vitals:   12/25/17 0925  TempSrc: Oral  PainSc:       Patients Stated Pain Goal: 4 (12/25/17 0803)  Complications: No apparent anesthesia complications

## 2017-12-25 NOTE — Progress Notes (Signed)
Seen and examined briefly-refer to admission HPI  Rx as Op with dental caries with clinda and naproxen-then develoepd trismus and found to have submandib odotogenic abcess-s/p extraction Teeth #1,#17,#32 and submand drain He is npo until Dental surgeon deems approp and is having difficulty talking still Continue IV cipro flagyll Await further eval from dentist and appreciate input  Pleas KochJai Leane Loring, MD [facility] [phone]) [phone]

## 2017-12-25 NOTE — Anesthesia Postprocedure Evaluation (Signed)
Anesthesia Post Note  Patient: Alain MarionKristopher S Vensel  Procedure(s) Performed: I AND D LEFT SUBMANDIBULAR ABSCESS, WITH MULTIPLE TEETH EXTRACTIONS (Left Mouth)     Patient location during evaluation: PACU Anesthesia Type: General Level of consciousness: awake and alert Pain management: pain level controlled Vital Signs Assessment: post-procedure vital signs reviewed and stable Respiratory status: spontaneous breathing, nonlabored ventilation, respiratory function stable and patient connected to nasal cannula oxygen Cardiovascular status: blood pressure returned to baseline and stable Postop Assessment: no apparent nausea or vomiting Anesthetic complications: no    Last Vitals:  Vitals:   12/25/17 1430 12/25/17 1504  BP: 138/90 138/68  Pulse: 88 87  Resp: 16 16  Temp: 37.6 C 36.7 C  SpO2: 96% 96%    Last Pain:  Vitals:   12/25/17 1504  TempSrc: Oral  PainSc:                  Eswin Worrell P Rogina Schiano

## 2017-12-25 NOTE — Brief Op Note (Signed)
12/25/2017  1:35 PM  PATIENT:  Erik Rogers  37 y.o. male  PRE-OPERATIVE DIAGNOSIS:  LEFT SUBMANDIBULAR ABSCESS; Carious teeth [phone]#1,17,32.  POST-OPERATIVE DIAGNOSIS:  LEFT SUBMANDIBULAR ABSCESS  PROCEDURE:  Procedure(s): I AND D LEFT SUBMANDIBULAR ABSCESS, WITH MULTIPLE TEETH EXTRACTIONS (Left)  SURGEON:  Surgeon(s) and Role:    * Cedra Villalon, DMD - Primary  ANESTHESIA:   general  EBL:  30 mL   BLOOD ADMINISTERED:none  DRAINS: Penrose drain in the left cervical area   LOCAL MEDICATIONS USED:  LIDOCAINE   Cultures: left submandibular abscess  COUNTS:  YES  TOURNIQUET:  * No tourniquets in log *  DICTATION: .Dragon Dictation  PLAN OF CARE: Admit for overnight observation  PATIENT DISPOSITION:  PACU - hemodynamically stable.   Delay start of Pharmacological VTE agent (>24hrs) due to surgical blood loss or risk of bleeding: not applicable

## 2017-12-25 NOTE — Progress Notes (Signed)
[facility] Postop Note  Subjective/Chief Complaint: POD0 s/p I&D and extractions. States that he is much improved, just sore. Having difficult swallowing due to the numbness, but tolerating fluids.   Objective: Vital signs in last 24 hours: Temp:  [97.7 F (36.5 C)-100 F (37.8 C)] 97.7 F (36.5 C) (01/19 1823) Pulse Rate:  [70-109] 88 (01/19 1823) Resp:  [15-18] 16 (01/19 1823) BP: (138-150)/(68-95) 150/86 (01/19 1823) SpO2:  [96 %-100 %] 96 % (01/19 1823) Weight:  [90.7 kg (200 lb)] 90.7 kg (200 lb) (01/18 1956)    Intake/Output from previous day: 01/18 0701 - 01/19 0700 In: 200 [IV Piggyback:200] Out: -  Intake/Output this shift: No intake/output data recorded.  Physical Exam: Gen: awake, alert in NAD HEENT: mod left facial edema, penrose in left cervical area with serosanguinous/heme fluid. Mod-severe trismus; intraoral wounds hemostatic. Hrt: rrr Lungs: clear Abd: n,nt,nd Neuro: V3 anesthesia  Lab Results:  Recent Labs    12/24/17 1954  WBC 13.2*  HGB 13.6  HCT 40.4  PLT 259   BMET Recent Labs    12/24/17 1954  NA 139  K 3.8  CL 101  CO2 26  GLUCOSE 92  BUN <5*  CREATININE 0.98  CALCIUM 9.0   Studies/Results: Ct Soft Tissue Neck W Contrast  Result Date: 12/25/2017 CLINICAL DATA:  Left mouth swelling radiating into the left neck. EXAM: CT NECK WITH CONTRAST TECHNIQUE: Multidetector CT imaging of the neck was performed using the standard protocol following the bolus administration of intravenous contrast. CONTRAST:  75mL ISOVUE-300 IOPAMIDOL (ISOVUE-300) INJECTION 61% COMPARISON:  None. FINDINGS: Pharynx and larynx: --Nasopharynx: Fossae of Rosenmuller are clear. Normal adenoid tonsils for age. --Oral cavity and oropharynx: There is an abscess at the left retromolar trigone measuring 3.4 x 1.8 x 2.5 cm. There is mild inflammatory change of the adjacent fat. There is a large carie at the surface of tooth 17, with a medium-sized periapical lucency at the root.  --Hypopharynx: Normal vallecula and pyriform sinuses. --Larynx: Normal epiglottis and pre-epiglottic space. Normal aryepiglottic and vocal folds. --Retropharyngeal space: No abscess, effusion or lymphadenopathy. Salivary glands: --Parotid: No mass lesion or inflammation. No sialolithiasis or ductal dilatation. --Submandibular: Symmetric without inflammation. No sialolithiasis or ductal dilatation. --Sublingual: Normal. No ranula or other visible lesion of the base of tongue and floor of mouth. Thyroid: Normal. Lymph nodes: Left level 1B lymph node measures 7 mm. Left level 2A lymph node measures 8 mm. No abnormal density lymph nodes. Vascular: Major cervical vessels are patent. Limited intracranial: Normal. Visualized orbits: Normal. Mastoids and visualized paranasal sinuses: No fluid levels or advanced mucosal thickening. No mastoid effusion. Skeleton: No bony spinal canal stenosis. No lytic or blastic lesions. Upper chest: Clear. Other: None. IMPRESSION: 1. Abscess of the left retromolar trigone measuring 3.4 x 1.8 x 2.5 cm, arising secondary to large dental carie and medium-sized periapical lucency at tooth 17. 2. There are a few borderline enlarged reactive left cervical lymph nodes, but no retropharyngeal abscess or other extension into the lower neck. Electronically Signed   By: Deatra Robinson M.D.   On: 12/25/2017 02:33    Anti-infectives: Anti-infectives (From admission, onward)   Start     Dose/Rate Route Frequency Ordered Stop   12/25/17 1600  metroNIDAZOLE (FLAGYL) IVPB 500 mg     500 mg 100 mL/hr over 60 Minutes Intravenous Every 8 hours 12/25/17 1446     12/25/17 0430  ciprofloxacin (CIPRO) IVPB 400 mg     400 mg 200 mL/hr over 60 Minutes  Intravenous 2 times daily 12/25/17 0401     12/25/17 0430  metroNIDAZOLE (FLAGYL) IVPB 500 mg  Status:  Discontinued     500 mg 100 mL/hr over 60 Minutes Intravenous Every 8 hours 12/25/17 0401 12/25/17 1446   12/25/17 0345  clindamycin (CLEOCIN) IVPB 600  mg     600 mg 100 mL/hr over 30 Minutes Intravenous  Once 12/25/17 0336 12/25/17 0418      Assessment/Plan: s/p Procedure(s): I AND D LEFT SUBMANDIBULAR ABSCESS, WITH MULTIPLE TEETH EXTRACTIONS (Left)   * swelling c/w procedure - keep HOB elevated * pain controlled - ibuprofen scheduled; Lortab elixir prn * cultures - abundant GPC/GN - cont cipro/flagyl IV; transition to oral at discharge for 10 day course * AM CBC * Chlorhexidine rinses tid x 2 weeks * will remove drain in 2-3 days if nonproductive * will reassess in AM, possible discharge if clinically improved with follow up at [facility] on Monday.  *please call Dr. Kenney Housemanrab at [phone] with question/concerns. Thank you.    LOS: 0 days    Vivia EwingJustin Jera Headings, DMD Oral & Maxillofacial Surgeon 12/25/2017

## 2017-12-25 NOTE — ED Provider Notes (Signed)
[facility] EMERGENCY DEPARTMENT Provider Note  CSN: [id number] Arrival date & time: 12/24/17 1913  Chief Complaint(s) Dental Problem  HPI Erik Rogers is a 37 y.o. male with a history of poor dentition presents to the emergency department for left lower dental pain with associated facial swelling.  Patient reports that he was evaluated 4 days ago in the emergency department for the same and diagnosed with a dental infection that was treated with clindamycin.  Patient has been taking naproxen and clindamycin as prescribed with improvement to the swelling however the pain has progressively worsen and also has associated trismus.  Pain is exacerbated with palpation, opening his mouth, attempting to chew.  No real alleviating factors.  He denies fevers but endorses chills and diaphoresis.  No neck pain or nuchal rigidity.  No headache.  No chest pain or shortness of breath.  No nausea, vomiting, diarrhea, or abdominal pain.  Denies any other physical complaints.  HPI  Past Medical History Past Medical History:  Diagnosis Date  . Chicken pox   . Frequent headaches   . GERD (gastroesophageal reflux disease)    Patient Active Problem List   Diagnosis Date Noted  . Dental abscess 12/25/2017  . Dental infection 01/29/2017  . Excessive cerumen in both ear canals 01/29/2017  . Tobacco abuse 05/22/2016  . Anxiety and depression 05/22/2016  . RLS (restless legs syndrome) 03/31/2016  . GERD (gastroesophageal reflux disease) 03/31/2016   Home Medication(s) Prior to Admission medications   Medication Sig Start Date End Date Taking? Authorizing Provider  clindamycin (CLEOCIN) 150 MG capsule Take 3 capsules (450 mg total) by mouth 3 (three) times daily. 12/21/17   Law, Waylan Boga, PA-C  escitalopram (LEXAPRO) 10 MG tablet Take 1 tablet (10 mg total) by mouth daily. 05/21/16   Tommie Sams, DO  gabapentin (NEURONTIN) 300 MG capsule Take 1 capsule (300 mg total) by mouth at  bedtime. 03/30/16   Tommie Sams, DO  ibuprofen (ADVIL,MOTRIN) 800 MG tablet Take 1 tablet (800 mg total) by mouth every 8 (eight) hours as needed (pain). 04/26/16   Hagler, Jami L, PA-C  ipratropium (ATROVENT) 0.03 % nasal spray Place 2 sprays into both nostrils daily as needed for rhinitis.    [provider]  lidocaine (XYLOCAINE) 2 % solution Use as directed 15 mLs in the mouth or throat as needed for mouth pain. 11/12/17   Khatri, Hina, PA-C  pantoprazole (PROTONIX) 40 MG tablet Take 1 tablet (40 mg total) by mouth daily. 03/30/16   Tommie Sams, DO  traZODone (DESYREL) 50 MG tablet Take 0.5-1 tablets (25-50 mg total) by mouth at bedtime as needed for sleep. 05/21/16   Tommie Sams, DO                                                                                                                                    Past Surgical History  Past Surgical History:  Procedure Laterality Date  . ADENOIDECTOMY    . FINGER SURGERY     R index  . TONSILLECTOMY     Family History Family History  Problem Relation Age of Onset  . Hypertension Father   . Thyroid disease Father   . Anemia Brother   . Cancer Brother   . Heart disease Brother   . Stroke Other   . Heart disease Mother   . Stroke Mother     Social History Social History   Tobacco Use  . Smoking status: Former Smoker    Packs/day: 1.00    Types: Cigarettes  . Smokeless tobacco: Never Used  Substance Use Topics  . Alcohol use: No    Alcohol/week: 0.0 oz    Comment: 1 q month  . Drug use: No   Allergies Penicillins cross reactors  Review of Systems Review of Systems All other systems are reviewed and are negative for acute change except as noted in the HPI  Physical Exam Vital Signs  I have reviewed the triage vital signs BP (!) 141/86 (BP Location: Right Arm)   Pulse 80   Temp 98.7 F (37.1 C) (Oral)   Resp 18   Ht 6' (1.829 m)   Wt 90.7 kg (200 lb)   SpO2 98%   BMI 27.12 kg/m   Physical Exam    Constitutional: He is oriented to person, place, and time. He appears well-developed and well-nourished. No distress.  HENT:  Head: Normocephalic and atraumatic.  Right Ear: External ear normal.  Left Ear: External ear normal.  Nose: Nose normal.  Mouth/Throat: Mucous membranes are normal. There is trismus in the jaw. Abnormal dentition. No oropharyngeal exudate, posterior oropharyngeal edema, posterior oropharyngeal erythema or tonsillar abscesses. No tonsillar exudate.    Eyes: Conjunctivae and EOM are normal. No scleral icterus.  Neck: Normal range of motion and phonation normal.    Cardiovascular: Normal rate and regular rhythm.  Pulmonary/Chest: Effort normal. No stridor. No respiratory distress.  Abdominal: He exhibits no distension.  Musculoskeletal: Normal range of motion. He exhibits no edema.  Neurological: He is alert and oriented to person, place, and time.  Skin: He is not diaphoretic.  Psychiatric: He has a normal mood and affect. His behavior is normal.  Vitals reviewed.   ED Results and Treatments Labs (all labs ordered are listed, but only abnormal results are displayed) Labs Reviewed  CBC - Abnormal; Notable for the following components:      Result Value   WBC 13.2 (*)    All other components within normal limits  BASIC METABOLIC PANEL - Abnormal; Notable for the following components:   BUN <5 (*)    All other components within normal limits  HIV ANTIBODY (ROUTINE TESTING)  I-STAT CG4 LACTIC ACID, ED  EKG  EKG Interpretation  Date/Time:    Ventricular Rate:    PR Interval:    QRS Duration:   QT Interval:    QTC Calculation:   R Axis:     Text Interpretation:        Radiology Ct Soft Tissue Neck W Contrast  Result Date: 12/25/2017 CLINICAL DATA:  Left mouth swelling radiating into the left neck. EXAM: CT NECK WITH CONTRAST  TECHNIQUE: Multidetector CT imaging of the neck was performed using the standard protocol following the bolus administration of intravenous contrast. CONTRAST:  75mL ISOVUE-300 IOPAMIDOL (ISOVUE-300) INJECTION 61% COMPARISON:  None. FINDINGS: Pharynx and larynx: --Nasopharynx: Fossae of Rosenmuller are clear. Normal adenoid tonsils for age. --Oral cavity and oropharynx: There is an abscess at the left retromolar trigone measuring 3.4 x 1.8 x 2.5 cm. There is mild inflammatory change of the adjacent fat. There is a large carie at the surface of tooth 17, with a medium-sized periapical lucency at the root. --Hypopharynx: Normal vallecula and pyriform sinuses. --Larynx: Normal epiglottis and pre-epiglottic space. Normal aryepiglottic and vocal folds. --Retropharyngeal space: No abscess, effusion or lymphadenopathy. Salivary glands: --Parotid: No mass lesion or inflammation. No sialolithiasis or ductal dilatation. --Submandibular: Symmetric without inflammation. No sialolithiasis or ductal dilatation. --Sublingual: Normal. No ranula or other visible lesion of the base of tongue and floor of mouth. Thyroid: Normal. Lymph nodes: Left level 1B lymph node measures 7 mm. Left level 2A lymph node measures 8 mm. No abnormal density lymph nodes. Vascular: Major cervical vessels are patent. Limited intracranial: Normal. Visualized orbits: Normal. Mastoids and visualized paranasal sinuses: No fluid levels or advanced mucosal thickening. No mastoid effusion. Skeleton: No bony spinal canal stenosis. No lytic or blastic lesions. Upper chest: Clear. Other: None. IMPRESSION: 1. Abscess of the left retromolar trigone measuring 3.4 x 1.8 x 2.5 cm, arising secondary to large dental carie and medium-sized periapical lucency at tooth 17. 2. There are a few borderline enlarged reactive left cervical lymph nodes, but no retropharyngeal abscess or other extension into the lower neck. Electronically Signed   By: Deatra Robinson M.D.   On:  12/25/2017 02:33   Pertinent labs & imaging results that were available during my care of the patient were reviewed by me and considered in my medical decision making (see chart for details).  Medications Ordered in ED Medications  ciprofloxacin (CIPRO) IVPB 400 mg (not administered)  metroNIDAZOLE (FLAGYL) IVPB 500 mg (not administered)  HYDROmorphone (DILAUDID) injection 0.5-1 mg (not administered)  escitalopram (LEXAPRO) tablet 10 mg (not administered)  gabapentin (NEURONTIN) capsule 300 mg (not administered)  pantoprazole (PROTONIX) EC tablet 40 mg (not administered)  traZODone (DESYREL) tablet 25-50 mg (not administered)  dextrose 5 % and 0.45 % NaCl with KCl 20 mEq/L infusion (not administered)  acetaminophen (TYLENOL) tablet 650 mg (not administered)    Or  acetaminophen (TYLENOL) suppository 650 mg (not administered)  senna-docusate (Senokot-S) tablet 1 tablet (not administered)  bisacodyl (DULCOLAX) EC tablet 5 mg (not administered)  ondansetron (ZOFRAN) tablet 4 mg (not administered)    Or  ondansetron (ZOFRAN) injection 4 mg (not administered)  morphine 4 MG/ML injection 4 mg (4 mg Intravenous Given 12/25/17 0144)  iopamidol (ISOVUE-300) 61 % injection (75 mLs  Contrast Given 12/25/17 0206)  HYDROmorphone (DILAUDID) injection 0.5 mg (0.5 mg Intravenous Given 12/25/17 0319)  clindamycin (CLEOCIN) IVPB 600 mg (0 mg Intravenous Stopped 12/25/17 0418)  Procedures Procedures  (including critical care time)  Medical Decision Making / ED Course I have reviewed the nursing notes for this encounter and the patient's prior records (if available in EHR or on provided paperwork).  Clinical Course as of Dec 25 418  Sat Dec 25, 2017  0117 Screening labs obtained in triage revealed leukocytosis.  Otherwise grossly reassuring with a negative lactic acid.   Given the patient's presentation, will obtain a CT of the neck to assess for drainable abscess versus mandibular osteomyelitis.  [PC]  [phone] CT revealed a retromolar trigone on abscess measuring 3 x 2-1/2 cm.  Also notable for periapical lucency about tooth 17.  I discussed the case with Dr. Kenney Houseman from oral surgery who agreed to evaluate the patient in the morning for incision and drainage.  Requested IV antibiotics and admission to medicine.  [PC]    Clinical Course User Index [PC] Cardama, Amadeo Garnet, MD      Final Clinical Impression(s) / ED Diagnoses Final diagnoses:  Abscess of oral tissue      This chart was dictated using voice recognition software.  Despite best efforts to proofread,  errors can occur which can change the documentation meaning.   Nira Conn, MD 12/25/17 [phone]

## 2017-12-25 NOTE — ED Notes (Signed)
Pt being escorted via w/c to 6N. Significant other w/pt.

## 2017-12-25 NOTE — Anesthesia Preprocedure Evaluation (Addendum)
Anesthesia Evaluation  Patient identified by MRN, date of birth, ID band Patient awake    Reviewed: Allergy & Precautions, NPO status , Patient's Chart, lab work & pertinent test results  Airway Mallampati: IV  TM Distance: >3 FB Neck ROM: Full  Mouth opening: Limited Mouth Opening  Dental  (+) Poor Dentition   Pulmonary Current Smoker,    Pulmonary exam normal breath sounds clear to auscultation       Cardiovascular negative cardio ROS Normal cardiovascular exam Rhythm:Regular Rate:Normal     Neuro/Psych  Headaches, Anxiety    GI/Hepatic Neg liver ROS, GERD  Controlled,  Endo/Other  negative endocrine ROS  Renal/GU negative Renal ROS     Musculoskeletal negative musculoskeletal ROS (+)   Abdominal   Peds  Hematology negative hematology ROS (+)   Anesthesia Other Findings LEFT SUBMANDIBULAR ABSCESS  Reproductive/Obstetrics                            Anesthesia Physical Anesthesia Plan  ASA: II  Anesthesia Plan: General   Post-op Pain Management:    Induction: Intravenous  PONV Risk Score and Plan: 2 and Ondansetron, Dexamethasone, Midazolam and Treatment may vary due to age or medical condition  Airway Management Planned: Nasal ETT and Video Laryngoscope Planned  Additional Equipment:   Intra-op Plan:   Post-operative Plan: Extubation in OR  Informed Consent: I have reviewed the patients History and Physical, chart, labs and discussed the procedure including the risks, benefits and alternatives for the proposed anesthesia with the patient or authorized representative who has indicated his/her understanding and acceptance.   Dental advisory given  Plan Discussed with: CRNA  Anesthesia Plan Comments:         Anesthesia Quick Evaluation

## 2017-12-25 NOTE — Op Note (Signed)
PATIENT:  Erik Rogers  37 y.o. male  PRE-OPERATIVE DIAGNOSIS:  LEFT SUBMANDIBULAR ABSCESS; CARIOUS TEETH [phone]  POST-OPERATIVE DIAGNOSIS:  SAME  PROCEDURE:  Procedure(s): 1. I&D OF LEFT SUBMANDIBULAR SPACE ABSCESS - INTRAORAL AND EXTRAORAL APPROACHES. 2. SURGICAL REMOVAL OF TEETH [phone]  SURGEON:  Surgeon(s) and Role:    * Alleah Dearman, DMD - Primary  ANESTHESIA:   general  EBL:  30 mL   DRAINS: Penrose drain in the left cervical area   LOCAL MEDICATIONS USED:  LIDOCAINE   Cultures: left submandibular abscess for aerobic/anaerobic cultures.  Indication for procedures: Patient is a 37 year old male with large left submandibular odontogenic abscess as well as multiple carious/nonrestorable teeth #1, 17, 32.  He presents for surgical incision and drainage and extraction of multiple teeth under general anesthesia.  Procedure: The patient was identified in preoperative holding by both anesthesia and the maxillofacial teams.  Health history was reviewed.  Consent was verified.  The patient was then brought back to the operating room and placed on table in the supine position.  Standard ASA leads and monitors were placed.  The patient was preoxygenated.  He was slowly induced to ensure patency of the airway.  He was then intubated nasally without complication.  The tube was taped and secured by the anesthesia care team.  A throat pack was placed the patient was injected with 10 cc of 2% lidocaine with 100,000 epinephrine in bilateral mandible.  An incision in the left neck was marked out with a marking pen approximately 2 cm inferior to the inferior border of the mandible.  A 15 blade was used to make an incision through the skin and subcutaneous tissues.  A Kelly hemostat was then used to bluntly dissect superior to the inferior border of the mandible.  This was then advanced into the submandibular space where greenish yellow purulence was was noted for a total of approximately 10  cc.  Cultures were obtained and submitted.  The hemostat was used to explore the entire submandibular space as well as the left retropharyngeal space.  Following this attention was directed intraorally where a 15 blade was used to make a distal fascial incision in the left posterior mandible.  A full-thickness flap was elevated.  Tooth #17 was luxated and extracted with a dental elevator.  The site was thoroughly curetted the entire left side was copiously irrigated with saline with a total of 240 cc.  1/4 inch Penrose drain was placed into the left cervical wound and advanced into the submandibular space.  It was then secured with a single 2-0 silk suture.  Attention was then directed to the right side where 15 was used to make a sulcular incision adjacent to carious tooth #1.  A full-thickness flap was elevated.  Buccal bone was removed and the tooth was luxated and extracted.  The site was curetted and irrigated.  Tissues were then reapproximated with interrupted 3-0 chromic gut suture.  Attention was then directed to the right posterior mandible where a 15 blade was used to make a sulcular incision with a distal release.  A full-thickness flap was elevated.  Buccal bone was removed with Roger forcep.  Tooth #32 was identified luxated and extracted with a dental elevator.  Site was thoroughly curetted and irrigated.  Tissues were reapproximated with interrupted 3-0 chromic gut suture.  At this point the entire oral cavity was thoroughly irrigated.  The patient was injected with 10 cc of 0.5% Marcaine with 1:200000 epinephrine in the bilateral  mandible.  An orogastric tube was then placed into the stomach and retracted.  The patient was then returned to the anesthesia care team where he was extubated without event.  He was transported to the postanesthesia care unit for recovery.  He will then be admitted for overnight observation and continue with IV antibiotics.

## 2017-12-26 ENCOUNTER — Encounter (HOSPITAL_COMMUNITY): Payer: Self-pay | Admitting: Oral Surgery

## 2017-12-26 LAB — CBC
HCT: 38.3 % — ABNORMAL LOW (ref 39.0–52.0)
Hemoglobin: 12.8 g/dL — ABNORMAL LOW (ref 13.0–17.0)
MCH: 30.8 pg (ref 26.0–34.0)
MCHC: 33.4 g/dL (ref 30.0–36.0)
MCV: 92.3 fL (ref 78.0–100.0)
PLATELETS: 267 10*3/uL (ref 150–400)
RBC: 4.15 MIL/uL — ABNORMAL LOW (ref 4.22–5.81)
RDW: 12.7 % (ref 11.5–15.5)
WBC: 17 10*3/uL — ABNORMAL HIGH (ref 4.0–10.5)

## 2017-12-26 MED ORDER — CIPROFLOXACIN HCL 500 MG PO TABS: 500.0000 mg | ORAL_TABLET | Freq: Two times a day (BID) | ORAL | 0 refills | Status: AC

## 2017-12-26 MED ORDER — METRONIDAZOLE 500 MG PO TABS: 500.0000 mg | ORAL_TABLET | Freq: Three times a day (TID) | ORAL | 0 refills | Status: AC

## 2017-12-26 MED ORDER — HYDROCODONE-ACETAMINOPHEN 7.5-325 MG/15ML PO SOLN: 10.0000 mL | ORAL | 0 refills | Status: AC | PRN

## 2017-12-26 MED ORDER — HYDROCODONE-ACETAMINOPHEN 7.5-325 MG/15ML PO SOLN
10.0000 mL | ORAL | Status: DC | PRN
  Administered 2017-12-26: 10 mL via ORAL
  Filled 2017-12-26: qty 15

## 2017-12-26 NOTE — Progress Notes (Signed)
1 Day Post-Op   Subjective/Chief Complaint: Much improved per report, just increased soreness. Able to open wider. Tolerating fluids without difficulty. Pain minimal.   Objective: Vital signs in last 24 hours: Temp:  [97.7 F (36.5 C)-99.7 F (37.6 C)] 97.9 F (36.6 C) (01/20 0839) Pulse Rate:  [53-109] 73 (01/20 0839) Resp:  [15-19] 18 (01/20 0839) BP: (120-150)/(68-90) 144/73 (01/20 0839) SpO2:  [95 %-98 %] 98 % (01/20 0839)    Intake/Output from previous day: 01/19 0701 - 01/20 0700 In: 1206.7 [I.V.:1206.7] Out: 30 [Blood:30] Intake/Output this shift: No intake/output data recorded.  Gen: alert, NAD HEENT: mod left facial/cervical edema with TTP, drainage present, no active purulence noted. MIO 2.5cm, mild left soft palatal edema, intraoral wounds hemostatic.   Lab Results:  Recent Labs    12/24/17 1954 12/26/17 0813  WBC 13.2* 17.0*  HGB 13.6 12.8*  HCT 40.4 38.3*  PLT 259 267   BMET Recent Labs    12/24/17 1954  NA 139  K 3.8  CL 101  CO2 26  GLUCOSE 92  BUN <5*  CREATININE 0.98  CALCIUM 9.0   PT/INR No results for input(s): LABPROT, INR in the last 72 hours. ABG No results for input(s): PHART, HCO3 in the last 72 hours.  Invalid input(s): PCO2, PO2  Studies/Results: Ct Soft Tissue Neck W Contrast  Result Date: 12/25/2017 CLINICAL DATA:  Left mouth swelling radiating into the left neck. EXAM: CT NECK WITH CONTRAST TECHNIQUE: Multidetector CT imaging of the neck was performed using the standard protocol following the bolus administration of intravenous contrast. CONTRAST:  75mL ISOVUE-300 IOPAMIDOL (ISOVUE-300) INJECTION 61% COMPARISON:  None. FINDINGS: Pharynx and larynx: --Nasopharynx: Fossae of [place] are clear. Normal adenoid tonsils for age. --Oral cavity and oropharynx: There is an abscess at the left retromolar trigone measuring 3.4 x 1.8 x 2.5 cm. There is mild inflammatory change of the adjacent fat. There is a large carie at the surface  of tooth 17, with a medium-sized periapical lucency at the root. --Hypopharynx: Normal vallecula and pyriform sinuses. --Larynx: Normal epiglottis and pre-epiglottic space. Normal aryepiglottic and vocal folds. --Retropharyngeal space: No abscess, effusion or lymphadenopathy. Salivary glands: --Parotid: No mass lesion or inflammation. No sialolithiasis or ductal dilatation. --Submandibular: Symmetric without inflammation. No sialolithiasis or ductal dilatation. --Sublingual: Normal. No ranula or other visible lesion of the base of tongue and floor of mouth. Thyroid: Normal. Lymph nodes: Left level 1B lymph node measures 7 mm. Left level 2A lymph node measures 8 mm. No abnormal density lymph nodes. Vascular: Major cervical vessels are patent. Limited intracranial: Normal. Visualized orbits: Normal. Mastoids and visualized paranasal sinuses: No fluid levels or advanced mucosal thickening. No mastoid effusion. Skeleton: No bony spinal canal stenosis. No lytic or blastic lesions. Upper chest: Clear. Other: None. IMPRESSION: 1. Abscess of the left retromolar trigone measuring 3.4 x 1.8 x 2.5 cm, arising secondary to large dental carie and medium-sized periapical lucency at tooth 17. 2. There are a few borderline enlarged reactive left cervical lymph nodes, but no retropharyngeal abscess or other extension into the lower neck. Electronically Signed   By: Deatra Robinson M.D.   On: 12/25/2017 02:33    Anti-infectives: Anti-infectives (From admission, onward)   Start     Dose/Rate Route Frequency Ordered Stop   12/25/17 1600  metroNIDAZOLE (FLAGYL) IVPB 500 mg     500 mg 100 mL/hr over 60 Minutes Intravenous Every 8 hours 12/25/17 1446     12/25/17 0430  ciprofloxacin (CIPRO) IVPB 400 mg  400 mg 200 mL/hr over 60 Minutes Intravenous 2 times daily 12/25/17 0401     12/25/17 0430  metroNIDAZOLE (FLAGYL) IVPB 500 mg  Status:  Discontinued     500 mg 100 mL/hr over 60 Minutes Intravenous Every 8 hours 12/25/17  0401 12/25/17 1446   12/25/17 0345  clindamycin (CLEOCIN) IVPB 600 mg     600 mg 100 mL/hr over 30 Minutes Intravenous  Once 12/25/17 0336 12/25/17 0418      Assessment/Plan: s/p Procedure(s): I AND D LEFT SUBMANDIBULAR ABSCESS, WITH MULTIPLE TEETH EXTRACTIONS (Left)  POD1 s/p I&D and extractions. Progressing well, clinically much improved. Increased leukocytosis, though patient did receive perioperative dose of decadron. Pain controlled with ibuprofen/lortab elixir. Advance diet to mechanical soft. Patient stable for discharge from a maxillofacial standpoint.  Recs: 1. Cont Cipro/flagyl for total of 10 days. 2. Chlorhexidine rinses tid x 2 weeks. 3. Change left cervical dressing tid. Clean around penrose drain with q-tip applicator and H2O2 to remove eschar. 4. Will plan to removed penrose drain in office tomorrow.  5. Please have patient contact [facility] at [phone] tomorrow to set up appt.  *Please contact Dr. Kenney Housemanrab at [phone] with questions/conerns   LOS: 0 days    Erik Rogers 12/26/2017

## 2017-12-26 NOTE — Discharge Summary (Signed)
Physician Discharge Summary  Erik Rogers ZOX:[id number] DOB: August 28, 1981 DOA: 12/25/2017  PCP: Tommie Sams, DO  Admit date: 12/25/2017 Discharge date: 12/26/2017  Time spent: 25 minutes  Recommendations for Outpatient Follow-up:  1. Complete ciprofloxacin and Flagyl on 01/02/2018 2. Get CBC and differential as an outpatient in about 1 week 3. Recommend follow-up with dentist in 24 hours for drain assessment and?  Need for removal 4. Given a prescription of Percocet syrup 160 mils as had pretty severe pain every 3 as needed advised to use adjunctive ibuprofen for the same  Discharge Diagnoses:  Principal Problem:   Dental abscess Active Problems:   Anxiety and depression   Discharge Condition: Improved  Diet recommendation: Heart healthy  Filed Weights   12/24/17 1956  Weight: 90.7 kg (200 lb)    History of present illness:  37 year old male no known medical illnesses treated with clindamycin and Naprosyn outpatient for caries then developed trismus and found to have a submandibular odontogenic abscess in the lower mouth area Underwent surgery as well as drain placement on teeth #1, #17, #32 and placed on Cipro Flagyl IV and then transitioned to oral equivalent of the same-cleared by dentist for discharge on 1/20 and should follow-up closely with dentist as an outpatient  Consultations:  Dentistry   Discharge Exam: Vitals:   12/26/17 0452 12/26/17 0839  BP: 120/86 (!) 144/73  Pulse: (!) 53 73  Resp: 18 18  Temp: 98.5 F (36.9 C) 97.9 F (36.6 C)  SpO2: 96% 98%    General: alert pleasant oriented feels much better able to talk more clearly--area surrounding drain is still somewhat tender however is able to verbalize better and is able to swallow some liquid diet Cardiovascular: S1-S2 no murmur rub or gallop Respiratory: Clinically clear  Discharge Instructions   Discharge Instructions    Diet - low sodium heart healthy   Complete by:  As directed     Discharge instructions   Complete by:  As directed    Complete Cipro and flagyll-take pain meds sparingly-start with ibuprofen and use percocet if needed Keep drain in and follow post-op instructions as per your dentist   Increase activity slowly   Complete by:  As directed      Allergies as of 12/26/2017      Reactions   Penicillins Cross Reactors Other (See Comments)   Unknown       Medication List    STOP taking these medications   clindamycin 150 MG capsule Commonly known as:  CLEOCIN   lidocaine 2 % solution Commonly known as:  XYLOCAINE   naproxen 250 MG tablet Commonly known as:  NAPROSYN     TAKE these medications   ciprofloxacin 500 MG tablet Commonly known as:  CIPRO Take 1 tablet (500 mg total) by mouth 2 (two) times daily for 7 days.   HYDROcodone-acetaminophen 7.5-325 mg/15 ml solution Commonly known as:  HYCET Take 10 mLs by mouth every 3 (three) hours as needed for moderate pain or severe pain.   ibuprofen 800 MG tablet Commonly known as:  ADVIL,MOTRIN Take 1 tablet (800 mg total) by mouth every 8 (eight) hours as needed (pain).   metroNIDAZOLE 500 MG tablet Commonly known as:  FLAGYL Take 1 tablet (500 mg total) by mouth 3 (three) times daily for 7 days.      Allergies  Allergen Reactions  . Penicillins Cross Reactors Other (See Comments)    Unknown       The results of significant  diagnostics from this hospitalization (including imaging, microbiology, ancillary and laboratory) are listed below for reference.    Significant Diagnostic Studies: Ct Soft Tissue Neck W Contrast  Result Date: 12/25/2017 CLINICAL DATA:  Left mouth swelling radiating into the left neck. EXAM: CT NECK WITH CONTRAST TECHNIQUE: Multidetector CT imaging of the neck was performed using the standard protocol following the bolus administration of intravenous contrast. CONTRAST:  75mL ISOVUE-300 IOPAMIDOL (ISOVUE-300) INJECTION 61% COMPARISON:  None. FINDINGS: Pharynx and  larynx: --Nasopharynx: Fossae of [place] are clear. Normal adenoid tonsils for age. --Oral cavity and oropharynx: There is an abscess at the left retromolar trigone measuring 3.4 x 1.8 x 2.5 cm. There is mild inflammatory change of the adjacent fat. There is a large carie at the surface of tooth 17, with a medium-sized periapical lucency at the root. --Hypopharynx: Normal vallecula and pyriform sinuses. --Larynx: Normal epiglottis and pre-epiglottic space. Normal aryepiglottic and vocal folds. --Retropharyngeal space: No abscess, effusion or lymphadenopathy. Salivary glands: --Parotid: No mass lesion or inflammation. No sialolithiasis or ductal dilatation. --Submandibular: Symmetric without inflammation. No sialolithiasis or ductal dilatation. --Sublingual: Normal. No ranula or other visible lesion of the base of tongue and floor of mouth. Thyroid: Normal. Lymph nodes: Left level 1B lymph node measures 7 mm. Left level 2A lymph node measures 8 mm. No abnormal density lymph nodes. Vascular: Major cervical vessels are patent. Limited intracranial: Normal. Visualized orbits: Normal. Mastoids and visualized paranasal sinuses: No fluid levels or advanced mucosal thickening. No mastoid effusion. Skeleton: No bony spinal canal stenosis. No lytic or blastic lesions. Upper chest: Clear. Other: None. IMPRESSION: 1. Abscess of the left retromolar trigone measuring 3.4 x 1.8 x 2.5 cm, arising secondary to large dental carie and medium-sized periapical lucency at tooth 17. 2. There are a few borderline enlarged reactive left cervical lymph nodes, but no retropharyngeal abscess or other extension into the lower neck. Electronically Signed   By: Deatra RobinsonKevin  Herman M.D.   On: 12/25/2017 02:33    Microbiology: Recent Results (from the past 240 hour(s))  Surgical pcr screen     Status: None   Collection Time: 12/25/17  9:54 AM  Result Value Ref Range Status   MRSA, PCR NEGATIVE NEGATIVE Final   Staphylococcus aureus NEGATIVE  NEGATIVE Final    Comment: (NOTE) The Xpert SA Assay (FDA approved for NASAL specimens in patients 36 years of age and older), is one component of a comprehensive surveillance program. It is not intended to diagnose infection nor to guide or monitor treatment.   Aerobic/Anaerobic Culture (surgical/deep wound)     Status: None (Preliminary result)   Collection Time: 12/25/17  1:10 PM  Result Value Ref Range Status   Specimen Description ABSCESS  Final   Special Requests LEFT SUBMANDIBULAR  Final   Gram Stain   Final    ABUNDANT WBC PRESENT, PREDOMINANTLY PMN ABUNDANT GRAM POSITIVE COCCI IN CHAINS MODERATE GRAM NEGATIVE COCCOBACILLI FEW GRAM POSITIVE RODS    Culture PENDING  Incomplete   Report Status PENDING  Incomplete     Labs: Basic Metabolic Panel: Recent Labs  Lab 12/24/17 1954  NA 139  K 3.8  CL 101  CO2 26  GLUCOSE 92  BUN <5*  CREATININE 0.98  CALCIUM 9.0   Liver Function Tests: No results for input(s): AST, ALT, ALKPHOS, BILITOT, PROT, ALBUMIN in the last 168 hours. No results for input(s): LIPASE, AMYLASE in the last 168 hours. No results for input(s): AMMONIA in the last 168 hours. CBC: Recent Labs  Lab 12/24/17 1954 12/26/17 0813  WBC 13.2* 17.0*  HGB 13.6 12.8*  HCT 40.4 38.3*  MCV 92.2 92.3  PLT 259 267   Cardiac Enzymes: No results for input(s): CKTOTAL, CKMB, CKMBINDEX, TROPONINI in the last 168 hours. BNP: BNP (last 3 results) No results for input(s): BNP in the last 8760 hours.  ProBNP (last 3 results) No results for input(s): PROBNP in the last 8760 hours.  CBG: No results for input(s): GLUCAP in the last 168 hours.     Signed:  Rhetta Mura MD   [facility] 12/26/2017, 11:11 AM

## 2017-12-30 LAB — AEROBIC/ANAEROBIC CULTURE W GRAM STAIN (SURGICAL/DEEP WOUND)

## 2017-12-30 LAB — AEROBIC/ANAEROBIC CULTURE (SURGICAL/DEEP WOUND)

## 2019-01-16 IMAGING — CT CT NECK W/ CM
4 series · 14 of 33 positions shown, 17 images · IV contrast (Omni 300)
Comparison: None.

CLINICAL DATA: Left mouth swelling radiating into the left neck.

EXAM:
CT NECK WITH CONTRAST
TECHNIQUE: Multidetector CT imaging of the neck was performed using the
standard protocol following the bolus administration of intravenous
contrast.
CONTRAST:  75mL 9VEICE-POO IOPAMIDOL (9VEICE-POO) INJECTION 61%

[Series 3: neck 2.0 st · axial · 0.51mm/px · z∈[-271,-91]mm · 5 of 136 slices shown, 7 images (1 of 3)]
[im 23/136  soft-tissue]
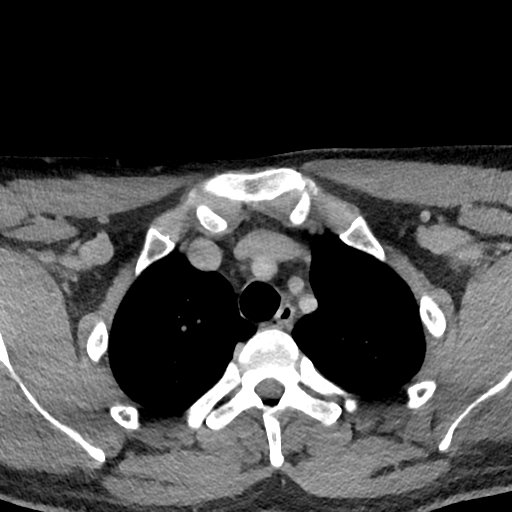
[im 23/136  bone]
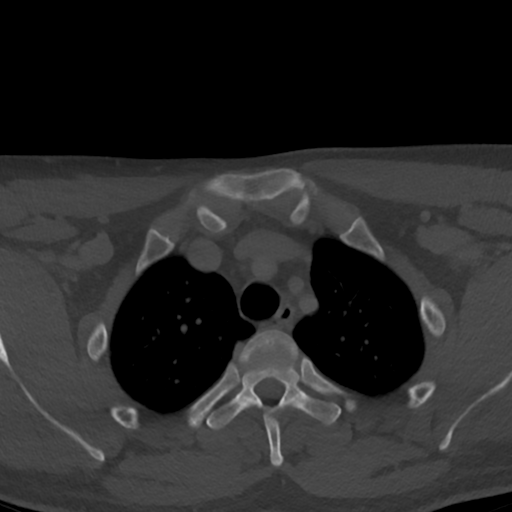
[im 46/136  bone]
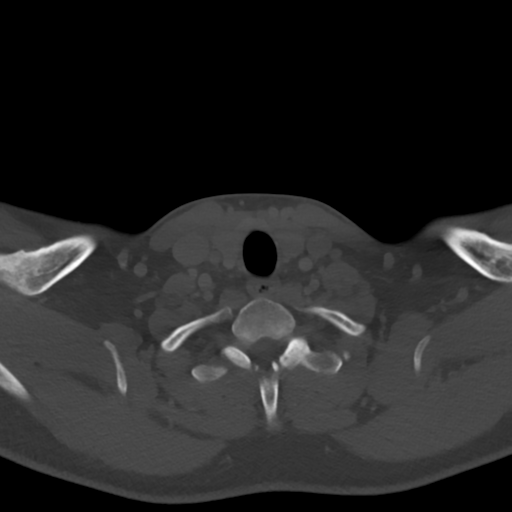
[im 68/136  bone]
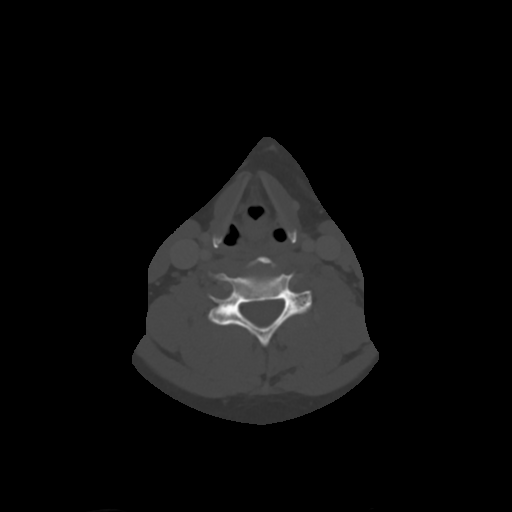
[im 91/136  bone]
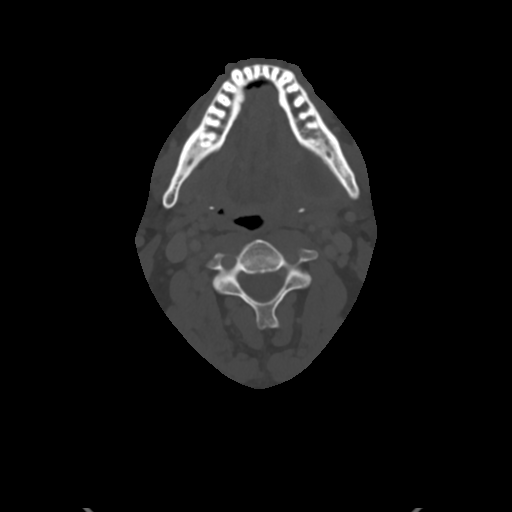
[im 113/136  soft-tissue]
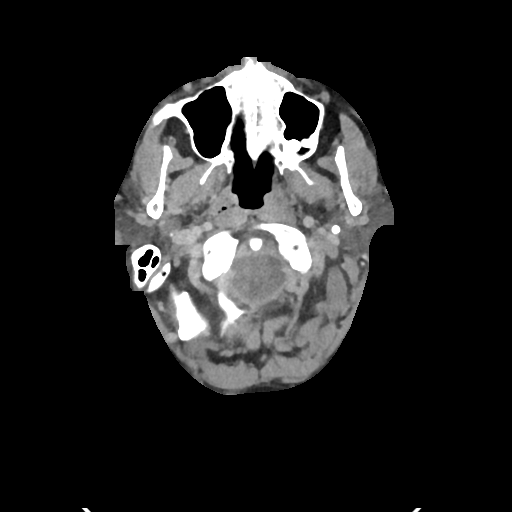
[im 113/136  bone]
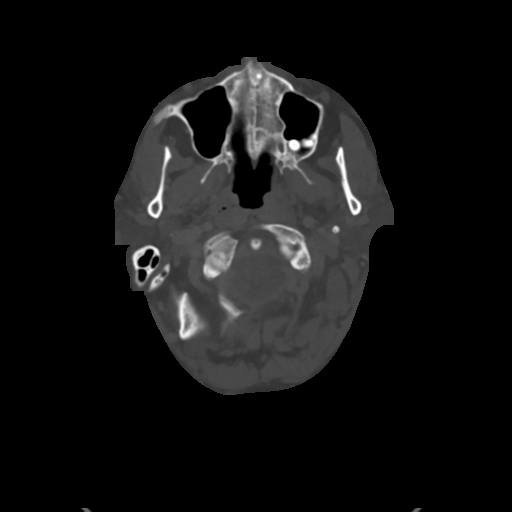

[Series 5: neck 2.0 st · sagittal · 0.53mm/px · 5 of 96 slices shown, 6 images (2 of 3)]
[im 32/96  bone]
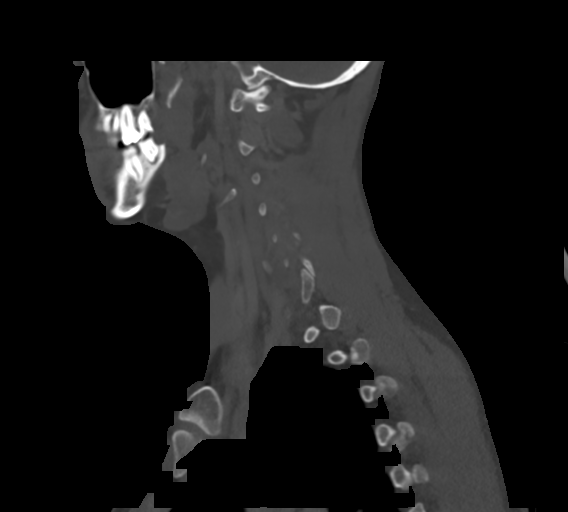
[im 40/96  bone]
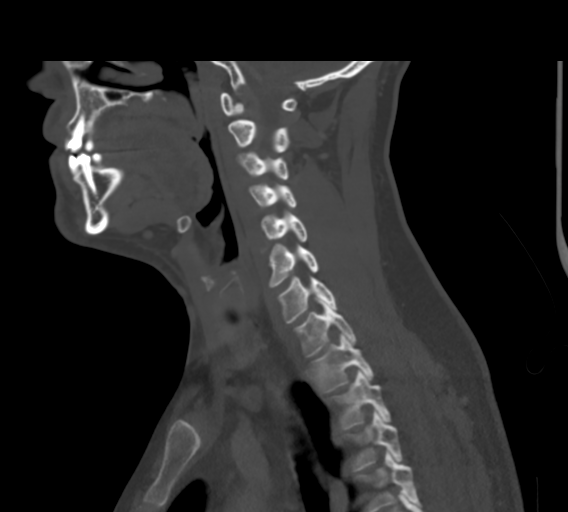
[im 48/96  soft-tissue]
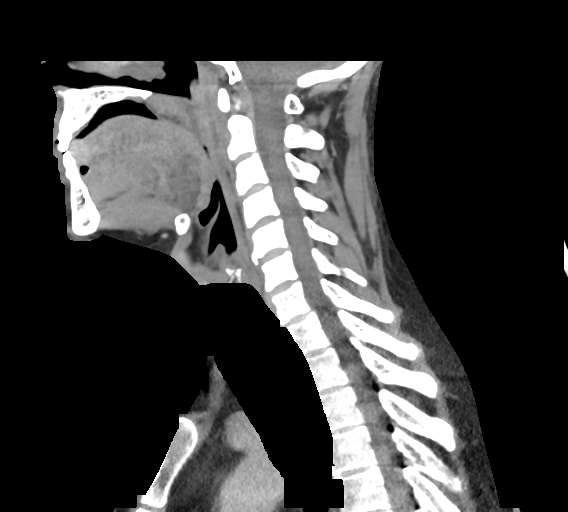
[im 48/96  bone]
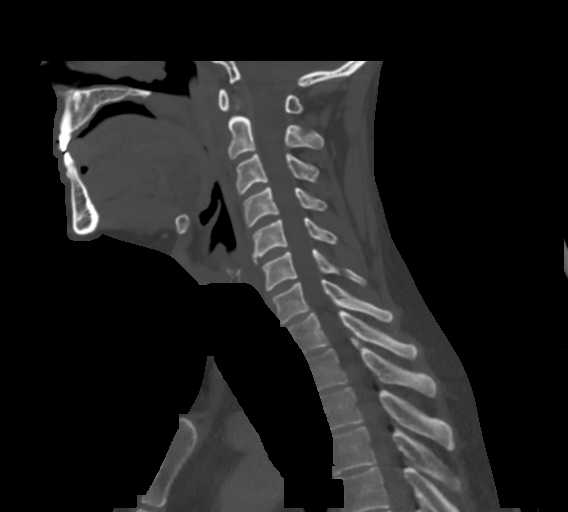
[im 56/96  bone]
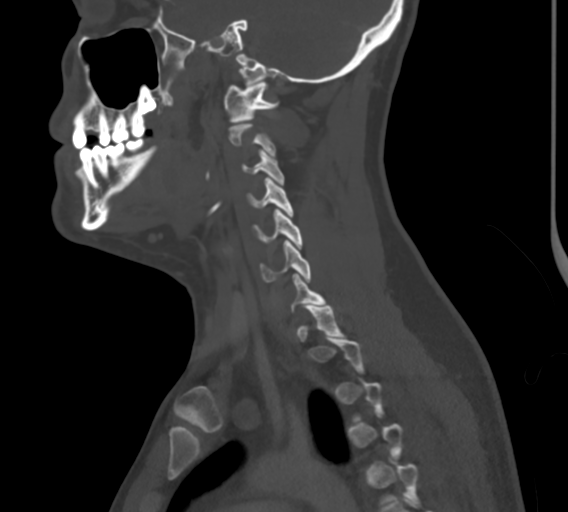
[im 64/96  bone]
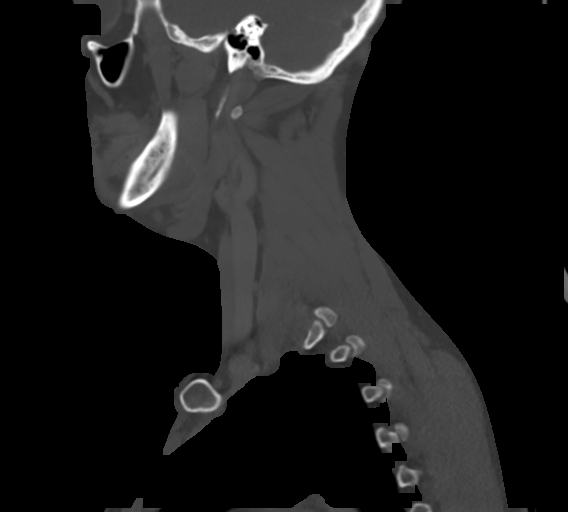

[Series 6: neck 2.0 st · coronal · 0.53mm/px · 3 of 115 slices shown (3 of 3)]
[im 23/115  bone]
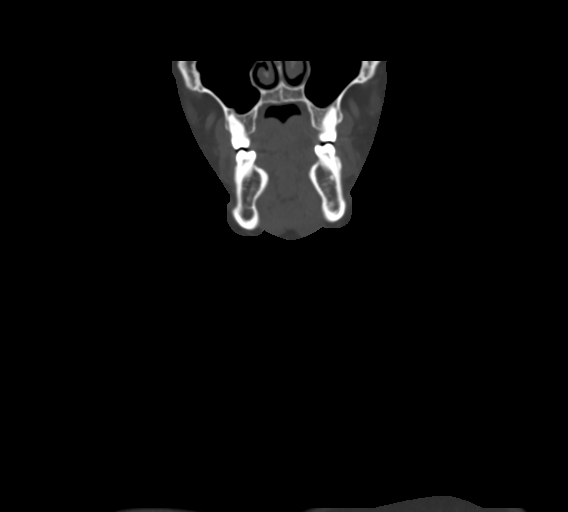
[im 46/115  bone]
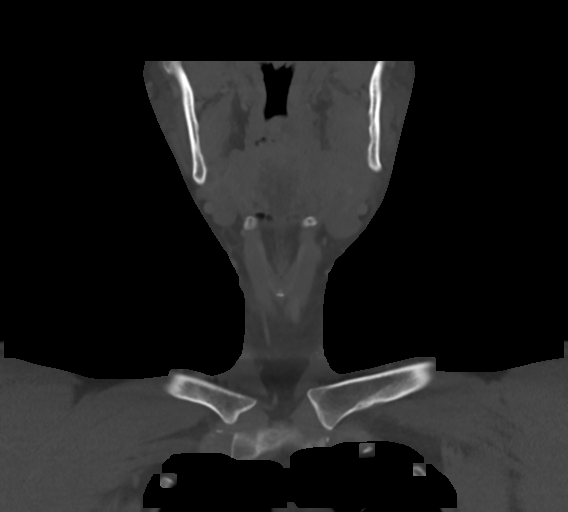
[im 69/115  bone]
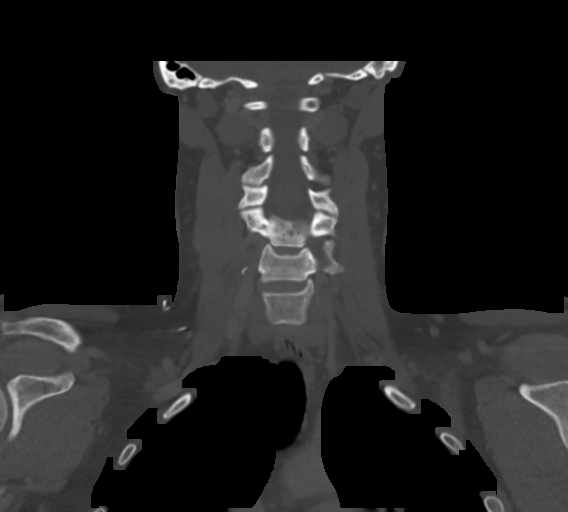

[Series 7: neck 2.0 st orthogonal · axial · 0.39mm/px · 1 of 135 slices shown]
[im 23/135  bone]
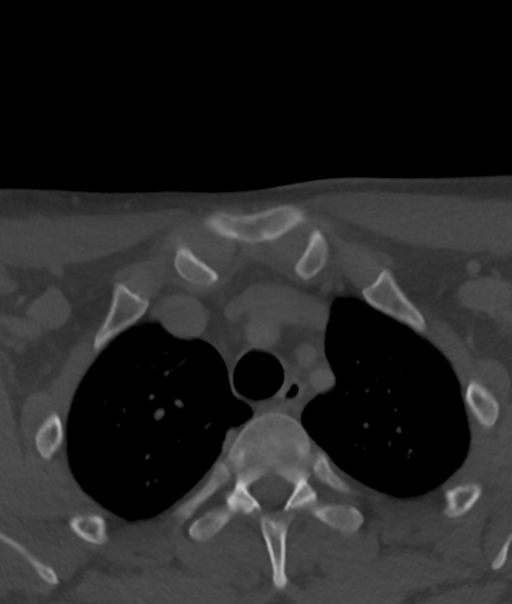

[14 of 33 positions shown; findings below may reference images not displayed]

FINDINGS: Pharynx and larynx:

--Nasopharynx: Fossae of [place] are clear. Normal adenoid
tonsils for age.

--Oral cavity and oropharynx: There is an abscess at the left
retromolar trigone measuring 3.4 x 1.8 x 2.5 cm. There is mild
inflammatory change of the adjacent fat. There is a large carie at
the surface of tooth 17, with a medium-sized periapical lucency at
the root.

--Hypopharynx: Normal vallecula and pyriform sinuses.

--Larynx: Normal epiglottis and pre-epiglottic space. Normal
aryepiglottic and vocal folds.

--Retropharyngeal space: No abscess, effusion or lymphadenopathy.

Salivary glands:

--Parotid: No mass lesion or inflammation. No sialolithiasis or
ductal dilatation.

--Submandibular: Symmetric without inflammation. No sialolithiasis
or ductal dilatation.

--Sublingual: Normal. No ranula or other visible lesion of the base
of tongue and floor of mouth.

Thyroid: Normal.

Lymph nodes: Left level 1B lymph node measures 7 mm. Left level 2A
lymph node measures 8 mm. No abnormal density lymph nodes.

Vascular: Major cervical vessels are patent.

Limited intracranial: Normal.

Visualized orbits: Normal.

Mastoids and visualized paranasal sinuses: No fluid levels or
advanced mucosal thickening. No mastoid effusion.

Skeleton: No bony spinal canal stenosis. No lytic or blastic
lesions.

Upper chest: Clear.

Other: None.
IMPRESSION: 1. Abscess of the left retromolar trigone measuring 3.4 x 1.8 x
cm, arising secondary to large dental [place] and medium-sized
periapical lucency at tooth 17.
2. There are a few borderline enlarged reactive left cervical lymph
nodes, but no retropharyngeal abscess or other extension into the
lower neck.

## 2020-05-02 ENCOUNTER — Other Ambulatory Visit: Payer: Self-pay

## 2020-05-02 ENCOUNTER — Emergency Department
Admission: EM | Admit: 2020-05-02 | Discharge: 2020-05-02 | Disposition: A | Discharge status: Home / Self Care | Payer: Self-pay | Attending: Emergency Medicine | Admitting: Emergency Medicine

## 2020-05-02 DIAGNOSIS — Y929 Unspecified place or not applicable: Secondary | ICD-10-CM | POA: Insufficient documentation

## 2020-05-02 DIAGNOSIS — S61412A Laceration without foreign body of left hand, initial encounter: Secondary | ICD-10-CM | POA: Insufficient documentation

## 2020-05-02 DIAGNOSIS — S66822A Laceration of other specified muscles, fascia and tendons at wrist and hand level, left hand, initial encounter: Secondary | ICD-10-CM | POA: Insufficient documentation

## 2020-05-02 DIAGNOSIS — Z87891 Personal history of nicotine dependence: Secondary | ICD-10-CM | POA: Insufficient documentation

## 2020-05-02 DIAGNOSIS — Y9389 Activity, other specified: Secondary | ICD-10-CM | POA: Insufficient documentation

## 2020-05-02 DIAGNOSIS — Z23 Encounter for immunization: Secondary | ICD-10-CM | POA: Insufficient documentation

## 2020-05-02 DIAGNOSIS — W25XXXA Contact with sharp glass, initial encounter: Secondary | ICD-10-CM | POA: Insufficient documentation

## 2020-05-02 DIAGNOSIS — Y999 Unspecified external cause status: Secondary | ICD-10-CM | POA: Insufficient documentation

## 2020-05-02 DIAGNOSIS — S61512A Laceration without foreign body of left wrist, initial encounter: Secondary | ICD-10-CM | POA: Insufficient documentation

## 2020-05-02 MED ORDER — LIDOCAINE HCL (PF) 1 % IJ SOLN
5.0000 mL | Freq: Once | INTRAMUSCULAR | Status: AC
  Administered 2020-05-02: 5 mL
  Filled 2020-05-02: qty 5

## 2020-05-02 MED ORDER — TETANUS-DIPHTH-ACELL PERTUSSIS 5-2.5-18.5 LF-MCG/0.5 IM SUSP
0.5000 mL | Freq: Once | INTRAMUSCULAR | Status: AC
  Administered 2020-05-02: 0.5 mL via INTRAMUSCULAR
  Filled 2020-05-02: qty 0.5

## 2020-05-02 NOTE — ED Provider Notes (Signed)
[facility] Emergency Department Provider Note ____________________________________________  Time seen: 2131  I have reviewed the triage vital signs and the nursing notes.  HISTORY  Chief Complaint  Laceration  HPI Erik Rogers is a 39 y.o. male presents himself to the ED for evaluation of an accidental laceration to his radial left wrist.  Patient was attempting to place a window mounted air-conditioning unit, when the window in the trailer apparently fell, the single pane window caused a small laceration of cross the dorsal radial left wrist.  Patient denies any other injury at this time.  He believes he was able to visualize the tendon at his wrist at the time of the injury.  He presents with normal composite fist but does note some slight catching when he extends and flexes the thumb.  Patient reports an out of date tetanus status.  No other injuries reported at this time.   Past Medical History:  Diagnosis Date  . Chicken pox   . Frequent headaches   . GERD (gastroesophageal reflux disease)     Patient Active Problem List   Diagnosis Date Noted  . Dental abscess 12/25/2017  . Dental infection 01/29/2017  . Excessive cerumen in both ear canals 01/29/2017  . Tobacco abuse 05/22/2016  . Anxiety and depression 05/22/2016  . RLS (restless legs syndrome) 03/31/2016  . GERD (gastroesophageal reflux disease) 03/31/2016    Past Surgical History:  Procedure Laterality Date  . ADENOIDECTOMY    . FINGER SURGERY     R index  . TONSILLECTOMY    . TOOTH EXTRACTION Left 12/25/2017   Procedure: I AND D LEFT SUBMANDIBULAR ABSCESS, WITH MULTIPLE TEETH EXTRACTIONS;  Surgeon: Vivia Ewing, DMD;  Location: [facility] OR;  Service: Oral Surgery;  Laterality: Left;    Prior to Admission medications   Medication Sig Start Date End Date Taking? Authorizing Provider  HYDROcodone-acetaminophen (HYCET) 7.5-325 mg/15 ml solution Take 10 mLs by mouth every 3 (three) hours as  needed for moderate pain or severe pain. 12/26/17   Rhetta Mura, MD  ibuprofen (ADVIL,MOTRIN) 800 MG tablet Take 1 tablet (800 mg total) by mouth every 8 (eight) hours as needed (pain). 04/26/16   Hagler, Jami L, PA-C    Allergies Penicillins cross reactors  Family History  Problem Relation Age of Onset  . Hypertension Father   . Thyroid disease Father   . Anemia Brother   . Cancer Brother   . Heart disease Brother   . Stroke Other   . Heart disease Mother   . Stroke Mother     Social History Social History   Tobacco Use  . Smoking status: Former Smoker    Packs/day: 1.00    Types: Cigarettes  . Smokeless tobacco: Never Used  Substance Use Topics  . Alcohol use: No    Alcohol/week: 0.0 standard drinks    Comment: 1 q month  . Drug use: No    Review of Systems  Constitutional: Negative for fever. Cardiovascular: Negative for chest pain. Respiratory: Negative for shortness of breath. Gastrointestinal: Negative for abdominal pain, vomiting and diarrhea. Genitourinary: Negative for dysuria. Musculoskeletal: Negative for back pain.  Left thumb flexion/extension pain. Skin: Negative for rash.  Left wrist laceration as above. Neurological: Negative for headaches, focal weakness or numbness. ____________________________________________  PHYSICAL EXAM:  VITAL SIGNS: ED Triage Vitals [05/02/20 2105]  Enc Vitals Group     BP 125/81     Pulse Rate 79     Resp 18  Temp 98.3 F (36.8 C)     Temp Source Oral     SpO2 97 %     Weight 195 lb (88.5 kg)     Height 6' (1.829 m)     Head Circumference      Peak Flow      Pain Score 6     Pain Loc      Pain Edu?      Excl. in [place]?     Constitutional: Alert and oriented. Well appearing and in no distress. Head: Normocephalic and atraumatic. Eyes: Conjunctivae are normal. Normal extraocular movements Cardiovascular: Normal rate, regular rhythm. Normal distal pulses. Respiratory: Normal respiratory effort.   Musculoskeletal: Normal composite fist on the left hand.  Patient with a small laceration to the radial aspect of the distal wrist.  Exploration after adequate anesthesia does reveal a partial transection to the EPL tendon.  Patient with normal thumb extension and abduction.  Nontender with normal range of motion in all extremities.  Neurologic:  Normal gross sensation. Normal intrinsic and opposition testing. No gross focal neurologic deficits are appreciated. Skin:  Skin is warm, dry and intact. No rash noted. ____________________________________________  PROCEDURES  Tdap 0.5 ml IM  .Marland KitchenLaceration Repair  Date/Time: 05/03/2020 9:44 PM Performed by: Melvenia Needles, PA-C Authorized by: Melvenia Needles, PA-C   Consent:    Consent obtained:  Verbal   Consent given by:  Patient   Risks discussed:  Pain Anesthesia (see MAR for exact dosages):    Anesthesia method:  Local infiltration   Local anesthetic:  Lidocaine 1% w/o epi Laceration details:    Location:  Hand   Hand location:  L wrist   Length (cm):  1.5   Depth (mm):  3 Repair type:    Repair type:  Simple Pre-procedure details:    Preparation:  Patient was prepped and draped in usual sterile fashion Exploration:    Wound extent: tendon damage     Tendon damage location:  Upper extremity   Upper extremity tendon damage location:  Finger extensor   Finger extensor tendon:  Extensor pollicis longus   Tendon damage extent:  Partial transection   Tendon repair plan:  Refer for evaluation   Contaminated: no   Treatment:    Area cleansed with:  Betadine and saline   Amount of cleaning:  Standard   Irrigation method:  Syringe Skin repair:    Repair method:  Sutures   Suture size:  4-0   Suture material:  Nylon   Suture technique:  Simple interrupted   Number of sutures:  3 Approximation:    Approximation:  Close Post-procedure details:    Dressing:  Non-adherent dressing   Patient tolerance of procedure:   Tolerated well, no immediate complications  ____________________________________________  INITIAL IMPRESSION / ASSESSMENT AND PLAN / ED COURSE  Patient with ED evaluation of an accidental laceration to the dorsal left wrist.  Small laceration however deep enough to creat a partial transection to the EPL tendon.  Visual wound is repaired and the patient is referred to [facility] for further management of the partial tendon laceration.  Return precautions have been reviewed.  Erik Rogers was evaluated in Emergency Department on 05/03/2020 for the symptoms described in the history of present illness. He was evaluated in the context of the global COVID-19 pandemic, which necessitated consideration that the patient might be at risk for infection with the SARS-CoV-2 virus that causes COVID-19. Institutional protocols and algorithms that pertain  to the evaluation of patients at risk for COVID-19 are in a state of rapid change based on information released by regulatory bodies including the CDC and federal and state organizations. These policies and algorithms were followed during the patient's care in the ED. ____________________________________________  FINAL CLINICAL IMPRESSION(S) / ED DIAGNOSES  Final diagnoses:  Wrist laceration, left, initial encounter  Laceration of hand involving extensor tendon, left, initial encounter      Lissa Hoard, PA-C 05/03/20 0019    Phineas Semen, MD 05/03/20 [phone]

## 2020-05-02 NOTE — ED Triage Notes (Signed)
Pt to the er for a small lac to the left lateral wrist . Pt putting in a window unit and glass broke. Pt has a c shaped lac. No active bleeding.

## 2020-05-02 NOTE — Discharge Instructions (Addendum)
You have had your wrist laceration repaired. You have a partial laceration to the extensor tendon of the thumb. You should keep the wound clean, dry, and covered. Follow-up with [facility] for suture removal and tendon evaluation. Return to the ED as needed.

## 2022-11-02 ENCOUNTER — Telehealth: Payer: Self-pay | Admitting: Nurse Practitioner

## 2022-11-02 DIAGNOSIS — K047 Periapical abscess without sinus: Secondary | ICD-10-CM

## 2022-11-02 MED ORDER — CLINDAMYCIN HCL 300 MG PO CAPS: 300.0000 mg | ORAL_CAPSULE | Freq: Three times a day (TID) | ORAL | 0 refills | Status: AC

## 2022-11-02 NOTE — Progress Notes (Signed)
Virtual Visit Consent   Erik Rogers, you are scheduled for a virtual visit with a [facility] provider today. Just as with appointments in the office, your consent must be obtained to participate. Your consent will be active for this visit and any virtual visit you may have with one of our providers in the next 365 days. If you have a MyChart account, a copy of this consent can be sent to you electronically.  As this is a virtual visit, video technology does not allow for your provider to perform a traditional examination. This may limit your provider's ability to fully assess your condition. If your provider identifies any concerns that need to be evaluated in person or the need to arrange testing (such as labs, EKG, etc.), we will make arrangements to do so. Although advances in technology are sophisticated, we cannot ensure that it will always work on either your end or our end. If the connection with a video visit is poor, the visit may have to be switched to a telephone visit. With either a video or telephone visit, we are not always able to ensure that we have a secure connection.  By engaging in this virtual visit, you consent to the provision of healthcare and authorize for your insurance to be billed (if applicable) for the services provided during this visit. Depending on your insurance coverage, you may receive a charge related to this service.  I need to obtain your verbal consent now. Are you willing to proceed with your visit today? Erik Rogers has provided verbal consent on 11/02/2022 for a virtual visit (video or telephone). Viviano Simas, FNP  Date: 11/02/2022 3:36 PM  Virtual Visit via Video Note   I, Viviano Simas, connected with  Erik Rogers  ([id number], June 26, 1981) on 11/02/22 at  3:45 PM EST by a video-enabled telemedicine application and verified that I am speaking with the correct person using two identifiers.  Location: Patient: Virtual Visit  Location Patient: Home Provider: Virtual Visit Location Provider: Home Office   I discussed the limitations of evaluation and management by telemedicine and the availability of in person appointments. The patient expressed understanding and agreed to proceed.    History of Present Illness: Erik Rogers is a 41 y.o. who identifies as a male who was assigned male at birth, and is being seen today for an infected tooth. He has an upcoming dentist appointment to evaluate, he feels that the area around his tooth that was recently broken is getting infected.   He has an appointment on December 6th   Denies fever  More pain when chewing on the left side   Able to open mouth without difficulty    Problems:  Patient Active Problem List   Diagnosis Date Noted   Dental abscess 12/25/2017   Dental infection 01/29/2017   Excessive cerumen in both ear canals 01/29/2017   Tobacco abuse 05/22/2016   Anxiety and depression 05/22/2016   RLS (restless legs syndrome) 03/31/2016   GERD (gastroesophageal reflux disease) 03/31/2016    Allergies:  Allergies  Allergen Reactions   Penicillins Cross Reactors Other (See Comments)    Unknown    Medications: None   Observations/Objective: Patient is well-developed, well-nourished in no acute distress.  Resting comfortably  at home.  Head is normocephalic, atraumatic.  No labored breathing.  Speech is clear and coherent with logical content.  Patient is alert and oriented at baseline.    Assessment and Plan:  1. Dental infection  -  clindamycin (CLEOCIN) 300 MG capsule; Take 1 capsule (300 mg total) by mouth 3 (three) times daily for 10 days.  Dispense: 30 capsule; Refill: 0   May alternate tylenol and ibuprofen for pain relief  Soft foods   Seek follow up in person for any worsening symptoms or onset of fever     Follow Up Instructions: I discussed the assessment and treatment plan with the patient. The patient was provided an  opportunity to ask questions and all were answered. The patient agreed with the plan and demonstrated an understanding of the instructions.  A copy of instructions were sent to the patient via MyChart unless otherwise noted below.    The patient was advised to call back or seek an in-person evaluation if the symptoms worsen or if the condition fails to improve as anticipated.  Time:  I spent 15 minutes with the patient via telehealth technology discussing the above problems/concerns.    Viviano Simas, FNP

## 2023-04-02 ENCOUNTER — Telehealth: Payer: Self-pay | Admitting: Physician Assistant

## 2023-04-02 ENCOUNTER — Ambulatory Visit (HOSPITAL_COMMUNITY): Payer: Self-pay

## 2023-04-02 DIAGNOSIS — T63301A Toxic effect of unspecified spider venom, accidental (unintentional), initial encounter: Secondary | ICD-10-CM

## 2023-04-02 DIAGNOSIS — L299 Pruritus, unspecified: Secondary | ICD-10-CM

## 2023-04-02 MED ORDER — TRIAMCINOLONE ACETONIDE 0.1 % EX CREA: 1.0000 | TOPICAL_CREAM | Freq: Three times a day (TID) | CUTANEOUS | 0 refills | Status: AC

## 2023-04-02 MED ORDER — SULFAMETHOXAZOLE-TRIMETHOPRIM 800-160 MG PO TABS: 1.0000 | ORAL_TABLET | Freq: Two times a day (BID) | ORAL | 0 refills | Status: AC

## 2023-04-02 NOTE — Patient Instructions (Signed)
Erik Rogers, thank you for joining [name], PA-C for today's virtual visit.  While this provider is not your primary care provider (PCP), if your PCP is located in our provider database this encounter information will be shared with them immediately following your visit.   A [facility] MyChart account gives you access to today's visit and all your visits, tests, and labs performed at [name] " click here if you don't have a [name] account or go to [email or web address]  Consent: (Patient) Erik Rogers provided verbal consent for this virtual visit at the beginning of the encounter.  Current Medications:  Current Outpatient Medications:    sulfamethoxazole-trimethoprim (BACTRIM DS) 800-160 MG tablet, Take 1 tablet by mouth 2 (two) times daily., Disp: 14 tablet, Rfl: 0   triamcinolone cream (KENALOG) 0.1 %, Apply 1 Application topically 3 (three) times daily., Disp: 30 g, Rfl: 0   HYDROcodone-acetaminophen (HYCET) 7.5-325 mg/15 ml solution, Take 10 mLs by mouth every 3 (three) hours as needed for moderate pain or severe pain., Disp: 160 mL, Rfl: 0   ibuprofen (ADVIL,MOTRIN) 800 MG tablet, Take 1 tablet (800 mg total) by mouth every 8 (eight) hours as needed (pain)., Disp: 60 tablet, Rfl: 0   Medications ordered in this encounter:  Meds ordered this encounter  Medications   sulfamethoxazole-trimethoprim (BACTRIM DS) 800-160 MG tablet    Sig: Take 1 tablet by mouth 2 (two) times daily.    Dispense:  14 tablet    Refill:  0    Order Specific Question:   Supervising Provider    Answer:   Merrilee Jansky [id number]   triamcinolone cream (KENALOG) 0.1 %    Sig: Apply 1 Application topically 3 (three) times daily.    Dispense:  30 g    Refill:  0    Order Specific Question:   Supervising Provider    Answer:   Merrilee Jansky [id number]     *If you need refills on other medications prior to your next appointment, please contact  your pharmacy*  Follow-Up: Call back or seek an in-person evaluation if the symptoms worsen or if the condition fails to improve as anticipated.   Virtual Care [phone]  Other Instructions  Spider Bite Spider bites are not common. When spider bites do happen, most do not cause serious health problems. There are only a few types of spider bites that can cause serious health problems. What are the causes? A spider bite is often caused by a person accidentally making contact with a spider in a way that traps the spider against the skin. What increases the risk? You are more likely to be bitten by a spider if: You live in an area where spiders live, and you disturb their habitat. You work outdoors, such as a Clinical biochemist. You participate in certain outdoor activities, such as playing in leaves or hiking. What are the signs or symptoms? Symptoms may vary depending on the type of spider. Some spider bites may cause symptoms within 1 hour after the bite. For other spider bites, it may take 1-2 days for symptoms to develop. Common symptoms of this condition include: A raised area that is red. Redness and swelling around the area of the bite or bites. Discomfort or pain in the area of the bite. A few types of spiders, such as the black widow or the brown recluse, can inject poison (venom) into a bite wound. This venom causes more serious symptoms.  Symptoms of a venomous spider bite vary and may include: Muscle cramps. Nausea, vomiting, or abdominal pain. A fever. A skin sore (lesion) that spreads. This can break into an open wound (skin ulcer). Light-headedness or dizziness. How is this diagnosed? This condition may be diagnosed based on your symptoms and a physical exam. Your health care provider will ask about the history of your injury and any details you may have about the spider. This may help to determine what type of spider bit you. How is this treated? Many  spider bites do not require treatment. If needed, this condition may be treated by: Icing and keeping the bite area raised (elevated). Taking or applying over-the-counter or prescription medicines to help control symptoms such as pain and itching. Having a tetanus shot, if needed. Taking antibiotic medicine. Follow these instructions at home: Medicines Take or apply over-the-counter and prescription medicines only as told by your health care provider. If you were prescribed an antibiotic medicine, take or apply it as told by your health care provider. Do not stop using the antibiotic even if you start to feel better or if your condition improves. Managing pain and swelling  If directed, put ice on the bite area. To do this: Put ice in a plastic bag. Place a towel between your skin and the bag. Leave the ice on for 20 minutes, 2-3 times a day. Remove the ice if your skin turns bright red. This is very important. If you cannot feel pain, heat, or cold, you have a greater risk of damage to the area. Elevate the bite area above the level of your heart while you are sitting or lying down. General instructions  Do not scratch the bite area. Keep the bite area clean and dry. Wash the bite area daily with soap and water as told by your health care provider. Keep all follow-up visits. This is important. Contact a health care provider if: Your bite does not get better after 3 days of treatment. Your bite turns black or purple. You have increased redness, swelling, or pain at the site of the bite. Get help right away if: You develop shortness of breath or chest pain. You have fluid, blood, or pus coming from the bite area. You have muscle cramps or painful muscle spasms. You develop abdominal pain, nausea, or vomiting. You feel unusually tired (fatigued) or sleepy. These symptoms may represent a serious problem that is an emergency. Do not wait to see if the symptoms will go away. Get medical  help right away. Call your local emergency services (911 in the [place] Do not drive yourself to the hospital. Summary Spider bites are not common. When spider bites do happen, most do not cause serious health problems. Take or apply over-the-counter and prescription medicines only as told by your health care provider. Keep the bite area clean and dry. Wash the bite area daily with soap and water as told by your health care provider. Contact a health care provider if you have increased redness, swelling, or pain at the site of the bite. Get help right away if you develop shortness of breath or chest pain. This information is not intended to replace advice given to you by your health care provider. Make sure you discuss any questions you have with your health care provider. Document Revised: 09/11/2020 Document Reviewed: 09/11/2020 Elsevier Patient Education  2023 [employer].    If you have been instructed to have an in-person evaluation today at a local Urgent Care  facility, please use the link below. It will take you to a list of all of our available [facility], including address, phone number and hours of operation. Please do not delay care.  [facility]  If you or a family member do not have a primary care provider, use the link below to schedule a visit and establish care. When you choose a [facility] primary care physician or advanced practice provider, you gain a long-term partner in health. Find a Primary Care Provider  Learn more about [facility] in-office and virtual care options: [facility]

## 2023-04-02 NOTE — Progress Notes (Signed)
Virtual Visit Consent   Erik Rogers, you are scheduled for a virtual visit with a [facility] provider today. Just as with appointments in the office, your consent must be obtained to participate. Your consent will be active for this visit and any virtual visit you may have with one of our providers in the next 365 days. If you have a MyChart account, a copy of this consent can be sent to you electronically.  As this is a virtual visit, video technology does not allow for your provider to perform a traditional examination. This may limit your provider's ability to fully assess your condition. If your provider identifies any concerns that need to be evaluated in person or the need to arrange testing (such as labs, EKG, etc.), we will make arrangements to do so. Although advances in technology are sophisticated, we cannot ensure that it will always work on either your end or our end. If the connection with a video visit is poor, the visit may have to be switched to a telephone visit. With either a video or telephone visit, we are not always able to ensure that we have a secure connection.  By engaging in this virtual visit, you consent to the provision of healthcare and authorize for your insurance to be billed (if applicable) for the services provided during this visit. Depending on your insurance coverage, you may receive a charge related to this service.  I need to obtain your verbal consent now. Are you willing to proceed with your visit today? Erik Rogers has provided verbal consent on 04/02/2023 for a virtual visit (video or telephone). [name], PA-C  Date: 04/02/2023 9:05 AM  Virtual Visit via Video Note   I, [name], connected with  Erik Rogers  ([id number], [birth year]) on 04/02/23 at  9:00 AM EDT by a video-enabled telemedicine application and verified that I am speaking with the correct person using two identifiers.  Location: Patient: Virtual  Visit Location Patient: Home Provider: Virtual Visit Location Provider: Home Office   I discussed the limitations of evaluation and management by telemedicine and the availability of in person appointments. The patient expressed understanding and agreed to proceed.    History of Present Illness: Erik Rogers is a 42 y.o. who identifies as a male who was assigned male at birth, and is being seen today for spider bite. 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 He has had this once before many years ago and was told it was most likely a brown recluse bite. It is on the left medial lower leg just above the ankle (medial malleolus). There is mild discomfort, redness, mild swelling, and itching. Two puncture wounds are noted centrally. There is no drainage, no foul odor, no skin necrosis. Denies fevers, chills, nausea, vomiting.    Problems:  Patient Active Problem List   Diagnosis Date Noted   Dental abscess 12/25/2017   Dental infection 01/29/2017   Excessive cerumen in both ear canals 01/29/2017   Tobacco abuse 05/22/2016   Anxiety and depression 05/22/2016   RLS (restless legs syndrome) 03/31/2016   GERD (gastroesophageal reflux disease) 03/31/2016    Allergies:  Allergies  Allergen Reactions   Penicillins Cross Reactors Other (See Comments)    Unknown    Medications:  Current Outpatient Medications:    sulfamethoxazole-trimethoprim (BACTRIM DS) 800-160 MG tablet, Take 1 tablet by mouth 2 (two) times daily., Disp: 14 tablet, Rfl: 0   triamcinolone cream (KENALOG) 0.1 %,  Apply 1 Application topically 3 (three) times daily., Disp: 30 g, Rfl: 0   HYDROcodone-acetaminophen (HYCET) 7.5-325 mg/15 ml solution, Take 10 mLs by mouth every 3 (three) hours as needed for moderate pain or severe pain., Disp: 160 mL, Rfl: 0   ibuprofen (ADVIL,MOTRIN) 800 MG tablet, Take 1 tablet (800 mg total) by mouth every 8 (eight) hours as needed (pain)., Disp: 60 tablet, Rfl:  0  Observations/Objective: Patient is well-developed, well-nourished in no acute distress.  Resting comfortably at home.  Head is normocephalic, atraumatic.  No labored breathing.  Speech is clear and coherent with logical content.  Patient is alert and oriented at baseline.  Left lower leg superior to medial malleolus there is an erythematous lesion with 2 central puncture wounds with surrounding fine papular lesions  Assessment and Plan: 1. Spider bite wound, accidental or unintentional, initial encounter - sulfamethoxazole-trimethoprim (BACTRIM DS) 800-160 MG tablet; Take 1 tablet by mouth 2 (two) times daily.  Dispense: 14 tablet; Refill: 0  2. Itching - triamcinolone cream (KENALOG) 0.1 %; Apply 1 Application topically 3 (three) times daily.  Dispense: 30 g; Refill: 0  - Bactrim for infection - Triamcinolone for itching - Close monitoring - Seek in person evaluation if worsening  Follow Up Instructions: I discussed the assessment and treatment plan with the patient. The patient was provided an opportunity to ask questions and all were answered. The patient agreed with the plan and demonstrated an understanding of the instructions.  A copy of instructions were sent to the patient via MyChart unless otherwise noted below.    The patient was advised to call back or seek an in-person evaluation if the symptoms worsen or if the condition fails to improve as anticipated.  Time:  I spent 10 minutes with the patient via telehealth technology discussing the above problems/concerns.    [name], PA-C

## 2024-12-09 ENCOUNTER — Telehealth: Payer: Self-pay | Admitting: Nurse Practitioner

## 2024-12-09 DIAGNOSIS — J40 Bronchitis, not specified as acute or chronic: Secondary | ICD-10-CM

## 2024-12-09 MED ORDER — AZITHROMYCIN 250 MG PO TABS: ORAL_TABLET | ORAL | 0 refills | Status: AC

## 2024-12-09 MED ORDER — BENZONATATE 200 MG PO CAPS: 200.0000 mg | ORAL_CAPSULE | Freq: Two times a day (BID) | ORAL | 0 refills | Status: AC | PRN

## 2024-12-09 NOTE — Progress Notes (Signed)
 We are sorry that you are not feeling well.  Here is how we plan to help!  Based on your presentation I believe you most likely have A cough due to bacteria.  When patients have a fever and a productive cough with a change in color or increased sputum production, we are concerned about bacterial bronchitis.  If left untreated it can progress to pneumonia.  If your symptoms do not improve with your treatment plan it is important that you contact your provider.   I have prescribed Azithromyin 250 mg: two tablets now and then one tablet daily for 4 additonal days    In addition you may use A prescription cough medication called Tessalon  Perles 100mg . You may take 1-2 capsules every 8 hours as needed for your cough.   From your responses in the eVisit questionnaire you describe inflammation in the upper respiratory tract which is causing a significant cough.  This is commonly called Bronchitis and has four common causes:   Allergies Viral Infections Acid Reflux Bacterial Infection Allergies, viruses and acid reflux are treated by controlling symptoms or eliminating the cause. An example might be a cough caused by taking certain blood pressure medications. You stop the cough by changing the medication. Another example might be a cough caused by acid reflux. Controlling the reflux helps control the cough.  USE OF BRONCHODILATOR (RESCUE) INHALERS: There is a risk from using your bronchodilator too frequently.  The risk is that over-reliance on a medication which only relaxes the muscles surrounding the breathing tubes can reduce the effectiveness of medications prescribed to reduce swelling and congestion of the tubes themselves.  Although you feel brief relief from the bronchodilator inhaler, your asthma may actually be worsening with the tubes becoming more swollen and filled with mucus.  This can delay other crucial treatments, such as oral steroid medications. If you need to use a bronchodilator  inhaler daily, several times per day, you should discuss this with your provider.  There are probably better treatments that could be used to keep your asthma under control.     HOME CARE Only take medications as instructed by your medical team. Complete the entire course of an antibiotic. Drink plenty of fluids and get plenty of rest. Avoid close contacts especially the very young and the elderly Cover your mouth if you cough or cough into your sleeve. Always remember to wash your hands A steam or ultrasonic humidifier can help congestion.   GET HELP RIGHT AWAY IF: You develop worsening fever. You become short of breath You cough up blood. Your symptoms persist after you have completed your treatment plan MAKE SURE YOU  Understand these instructions. Will watch your condition. Will get help right away if you are not doing well or get worse.  Your e-visit answers were reviewed by a board certified advanced clinical practitioner to complete your personal care plan.  Depending on the condition, your plan could have included both over the counter or prescription medications. If there is a problem please reply  once you have received a response from your provider. Your safety is important to us .  If you have drug allergies check your prescription carefully.    You can use MyChart to ask questions about today's visit, request a non-urgent call back, or ask for a work or school excuse for 24 hours related to this e-Visit. If it has been greater than 24 hours you will need to follow up with your provider, or enter a new e-Visit  to address those concerns. You will get an e-mail in the next two days asking about your experience.  I hope that your e-visit has been valuable and will speed your recovery. Thank you for using e-visits.   I have spent 5 minutes in review of e-visit questionnaire, review and updating patient chart, medical decision making and response to patient.   Tashiana Lamarca W Nicco Reaume,  NP
